# Patient Record
Sex: Female | Born: 1985 | Race: Black or African American | Hispanic: No | Marital: Single | Smoking: Never smoker
Health system: Southern US, Community
[De-identification: ages and names within clinical notes are randomized; demographics above are authoritative.]

## PROBLEM LIST (undated history)

## (undated) DIAGNOSIS — Z8742 Personal history of other diseases of the female genital tract: Secondary | ICD-10-CM

## (undated) DIAGNOSIS — F32A Depression, unspecified: Secondary | ICD-10-CM

## (undated) DIAGNOSIS — N879 Dysplasia of cervix uteri, unspecified: Secondary | ICD-10-CM

## (undated) DIAGNOSIS — F329 Major depressive disorder, single episode, unspecified: Secondary | ICD-10-CM

## (undated) HISTORY — DX: Personal history of other diseases of the female genital tract: Z87.42

## (undated) HISTORY — DX: Dysplasia of cervix uteri, unspecified: N87.9

## (undated) HISTORY — PX: CERVICAL CERCLAGE: SHX1329

---

## 2011-02-21 ENCOUNTER — Emergency Department: Payer: Self-pay | Admitting: Emergency Medicine

## 2011-04-19 ENCOUNTER — Emergency Department: Payer: Self-pay | Admitting: Internal Medicine

## 2013-04-21 HISTORY — PX: COLPOSCOPY: SHX161

## 2013-04-28 ENCOUNTER — Encounter: Payer: Self-pay | Admitting: Maternal & Fetal Medicine

## 2013-06-09 ENCOUNTER — Encounter: Payer: Self-pay | Admitting: Obstetrics and Gynecology

## 2013-06-21 ENCOUNTER — Inpatient Hospital Stay: Payer: Self-pay | Admitting: Obstetrics & Gynecology

## 2013-06-21 LAB — CBC WITH DIFFERENTIAL/PLATELET
Basophil %: 0.1 %
Eosinophil #: 0.2 10*3/uL (ref 0.0–0.7)
Eosinophil %: 0.6 %
HCT: 31 % — ABNORMAL LOW (ref 35.0–47.0)
Lymphocyte #: 1.1 10*3/uL (ref 1.0–3.6)
Lymphocyte %: 3.9 %
MCV: 66 fL — ABNORMAL LOW (ref 80–100)
Monocyte #: 2.9 x10 3/mm — ABNORMAL HIGH (ref 0.2–0.9)
Monocyte %: 10.8 %
Neutrophil #: 23 10*3/uL — ABNORMAL HIGH (ref 1.4–6.5)
Platelet: 177 10*3/uL (ref 150–440)
RDW: 14.8 % — ABNORMAL HIGH (ref 11.5–14.5)
WBC: 27.3 10*3/uL — ABNORMAL HIGH (ref 3.6–11.0)

## 2013-06-21 LAB — URINALYSIS, COMPLETE
Bilirubin,UR: NEGATIVE
Leukocyte Esterase: NEGATIVE
Nitrite: POSITIVE
Ph: 6 (ref 4.5–8.0)
Protein: NEGATIVE
RBC,UR: 4 /HPF (ref 0–5)
Specific Gravity: 1.009 (ref 1.003–1.030)
WBC UR: 1 /HPF (ref 0–5)

## 2013-06-21 LAB — DRUG SCREEN, URINE
Amphetamines, Ur Screen: NEGATIVE (ref ?–1000)
Barbiturates, Ur Screen: NEGATIVE (ref ?–200)
Benzodiazepine, Ur Scrn: NEGATIVE (ref ?–200)
Cocaine Metabolite,Ur ~~LOC~~: NEGATIVE (ref ?–300)
Methadone, Ur Screen: NEGATIVE (ref ?–300)
Opiate, Ur Screen: NEGATIVE (ref ?–300)
Tricyclic, Ur Screen: NEGATIVE (ref ?–1000)

## 2013-06-21 LAB — BASIC METABOLIC PANEL
Co2: 22 mmol/L (ref 21–32)
Creatinine: 0.64 mg/dL (ref 0.60–1.30)
EGFR (African American): >60
EGFR (Non-African Amer.): >60
Glucose: 100 mg/dL — ABNORMAL HIGH (ref 65–99)
Osmolality: 280 (ref 275–301)
Sodium: 141 mmol/L (ref 136–145)

## 2013-06-22 LAB — PATHOLOGY REPORT

## 2013-06-22 LAB — WBC: WBC: 17 10*3/uL — ABNORMAL HIGH (ref 3.6–11.0)

## 2013-06-22 LAB — HEMATOCRIT: HCT: 28.1 % — ABNORMAL LOW (ref 35.0–47.0)

## 2013-06-26 LAB — CULTURE, BLOOD (SINGLE)

## 2013-06-30 ENCOUNTER — Encounter: Payer: Self-pay | Admitting: Obstetrics and Gynecology

## 2014-08-29 ENCOUNTER — Emergency Department: Payer: Self-pay | Admitting: Internal Medicine

## 2014-09-02 ENCOUNTER — Emergency Department: Payer: Self-pay | Admitting: Emergency Medicine

## 2015-03-18 ENCOUNTER — Emergency Department: Payer: Self-pay | Admitting: Emergency Medicine

## 2015-05-01 NOTE — H&P (Signed)
L&D Evaluation:  History:  HPI 29 year old G2 P1001 with EDC=11/09/2013 by a 12 week 1 day ultrasound presents at 5 6/7 weeks via EMS. She reports she had PPROM and delivered a nonviable female infant at home. Upon arrival the baby was still attached to the cord and the placenta had not delivered. PNC at [facility] remarkable for early care hx of a prior SVD at term of a SGA baby (4-9), anemia, an LGSIL Pap (R/O HGSIL), an E.coli UTI 4/4, 5/2, 6/2 (UTI untreated from 5/2 to 6/2-did not pick up RX) and a short cx on [place] (06-09-2013). Patient had not yet begun vaginal progesterone for the short cx. Patient also has a hx of a speech and hearing impediment. LABS: AB POS, VI, RI   Presents with preterm labor and delivery at 19 6/7 weeks   Patient's Medical History Anemia, UTI   Patient's Surgical History none   Medications Pre Natal Vitamins   Allergies NKDA   Social History none   Family History Non-Contributory   ROS:  ROS see HPI   Exam:  Vital Signs T-101.4   General no apparent distress   Mental Status clear  tearful   Chest clear    Heart tachycardia, regular rhythm, no murmur   Abdomen slightly tender uterus   Back no CVAT   Pelvic no lacerations seen. Placenta at cx. minimal bleeding  noted   Impression:  Impression PPROM followed by PTL and Delivery of a nonviable female. Febrile. R/O UTI. R/O STI Possible chorio   Plan:  Plan LR with 20 U Pitocin begun at 125 ml/hr. Demerol 25 mgm IV given prior to assisting the delivery of the intact placenta trapped at the cervical os. GC/Chlamydai NAA sent. Cath UA sent as well as culture. Motrin for fever.   Comments CBC, met B   Electronic Signatures: Karene Fry (CNM)  (Signed 01-Jul-14 02:59)  Authored: L&D Evaluation   Last Updated: 01-Jul-14 02:59 by Karene Fry (CNM)

## 2015-05-22 ENCOUNTER — Other Ambulatory Visit: Payer: Self-pay | Admitting: Otolaryngology

## 2015-05-22 DIAGNOSIS — H9192 Unspecified hearing loss, left ear: Secondary | ICD-10-CM

## 2015-05-29 ENCOUNTER — Ambulatory Visit: Admission: RE | Admit: 2015-05-29 | Payer: Medicaid Other | Source: Ambulatory Visit

## 2015-06-05 ENCOUNTER — Other Ambulatory Visit: Payer: Self-pay | Admitting: Otolaryngology

## 2015-06-05 ENCOUNTER — Ambulatory Visit: Admission: RE | Admit: 2015-06-05 | Payer: Medicaid Other | Source: Ambulatory Visit

## 2015-06-05 ENCOUNTER — Ambulatory Visit
Admission: RE | Admit: 2015-06-05 | Discharge: 2015-06-05 | Disposition: A | Discharge status: Home / Self Care | Payer: Medicare Other | Source: Ambulatory Visit | Attending: Otolaryngology | Admitting: Otolaryngology

## 2015-06-05 DIAGNOSIS — H838X2 Other specified diseases of left inner ear: Secondary | ICD-10-CM | POA: Insufficient documentation

## 2015-06-05 DIAGNOSIS — H9190 Unspecified hearing loss, unspecified ear: Secondary | ICD-10-CM | POA: Diagnosis not present

## 2015-11-06 ENCOUNTER — Other Ambulatory Visit: Payer: Self-pay | Admitting: Family Medicine

## 2015-11-06 DIAGNOSIS — Z3201 Encounter for pregnancy test, result positive: Secondary | ICD-10-CM

## 2015-11-13 ENCOUNTER — Ambulatory Visit
Admission: RE | Admit: 2015-11-13 | Discharge: 2015-11-13 | Disposition: A | Discharge status: Home / Self Care | Payer: Medicare Other | Source: Ambulatory Visit | Attending: Family Medicine | Admitting: Family Medicine

## 2015-11-13 DIAGNOSIS — Z3A08 8 weeks gestation of pregnancy: Secondary | ICD-10-CM | POA: Diagnosis not present

## 2015-11-13 DIAGNOSIS — Z3201 Encounter for pregnancy test, result positive: Secondary | ICD-10-CM | POA: Diagnosis present

## 2015-11-13 DIAGNOSIS — Z36 Encounter for antenatal screening of mother: Secondary | ICD-10-CM | POA: Diagnosis present

## 2015-11-27 ENCOUNTER — Other Ambulatory Visit: Payer: Self-pay | Admitting: Family Medicine

## 2015-11-27 DIAGNOSIS — Z3481 Encounter for supervision of other normal pregnancy, first trimester: Secondary | ICD-10-CM

## 2015-12-07 ENCOUNTER — Ambulatory Visit: Admission: RE | Admit: 2015-12-07 | Payer: Medicare Other | Source: Ambulatory Visit

## 2015-12-23 NOTE — L&D Delivery Note (Signed)
Delivery Note At 3:04 PM a non-viable female was delivered via Vaginal Spontaneous Delivery (Presentation:Lt arm and then body and then vtx and legs ;  ).  APGAR: 0-0 , ; weight 9.2 oz (260 g).   Placenta status: , Spontaneous with 4 cm area that looked like an abruption,  Cord:  with the following complications:none. Bleeding controlled after delivery. Pt held baby and spent time with him. Pitocin after placenta. Amp and Gent infusing.   Anesthesia: None  Episiotomy: None Lacerations: None Suture Repair: none Est. Blood Loss (mL):  200 ml (clots)  Mom to postpartum.  Baby to [place].  Will stop Amp and Gent since pt is afebrile and chorioamnionitis should terminate since delivery has occurred.   Alexis Mercado 02/04/2016, 5:46 PM

## 2016-01-21 ENCOUNTER — Ambulatory Visit
Admission: RE | Admit: 2016-01-21 | Discharge: 2016-01-21 | Disposition: A | Discharge status: Home / Self Care | Payer: Medicare Other | Source: Ambulatory Visit | Attending: Family Medicine | Admitting: Family Medicine

## 2016-01-21 DIAGNOSIS — Z36 Encounter for antenatal screening of mother: Secondary | ICD-10-CM | POA: Insufficient documentation

## 2016-01-21 DIAGNOSIS — Z3A18 18 weeks gestation of pregnancy: Secondary | ICD-10-CM | POA: Diagnosis not present

## 2016-01-21 DIAGNOSIS — Z3481 Encounter for supervision of other normal pregnancy, first trimester: Secondary | ICD-10-CM

## 2016-01-24 ENCOUNTER — Other Ambulatory Visit: Payer: Self-pay | Admitting: Family Medicine

## 2016-01-24 DIAGNOSIS — Z3689 Encounter for other specified antenatal screening: Secondary | ICD-10-CM

## 2016-01-31 ENCOUNTER — Ambulatory Visit (HOSPITAL_BASED_OUTPATIENT_CLINIC_OR_DEPARTMENT_OTHER)
Admission: RE | Admit: 2016-01-31 | Discharge: 2016-01-31 | Disposition: A | Discharge status: Home / Self Care | Payer: Medicare Other | Source: Ambulatory Visit | Attending: Obstetrics and Gynecology | Admitting: Obstetrics and Gynecology

## 2016-01-31 ENCOUNTER — Ambulatory Visit: Payer: Medicare Other

## 2016-01-31 ENCOUNTER — Ambulatory Visit
Admission: RE | Admit: 2016-01-31 | Discharge: 2016-01-31 | Disposition: A | Discharge status: Home / Self Care | Payer: Medicare Other | Source: Ambulatory Visit | Attending: Family Medicine | Admitting: Family Medicine

## 2016-01-31 DIAGNOSIS — O09292 Supervision of pregnancy with other poor reproductive or obstetric history, second trimester: Secondary | ICD-10-CM

## 2016-01-31 DIAGNOSIS — O26879 Cervical shortening, unspecified trimester: Secondary | ICD-10-CM | POA: Diagnosis not present

## 2016-01-31 DIAGNOSIS — Z3689 Encounter for other specified antenatal screening: Secondary | ICD-10-CM

## 2016-01-31 DIAGNOSIS — Z3A19 19 weeks gestation of pregnancy: Secondary | ICD-10-CM | POA: Diagnosis not present

## 2016-01-31 DIAGNOSIS — O3442 Maternal care for other abnormalities of cervix, second trimester: Secondary | ICD-10-CM | POA: Diagnosis not present

## 2016-01-31 DIAGNOSIS — O09299 Supervision of pregnancy with other poor reproductive or obstetric history, unspecified trimester: Secondary | ICD-10-CM | POA: Insufficient documentation

## 2016-01-31 NOTE — Progress Notes (Signed)
[facility] Maternal-[facility]etal [facility] Consultation   Chief Complaint: History of midtrimester loss after short cervix and PPROM; current pregnancy complicated by short cervix  HPI: Ms. Alexis Mercado is a 30 y.o. [id number] at [redacted]w[redacted]d by [redacted]w[redacted]d [place] who presents in consultation from [facility] for prior pregnancy history of a 19 week delivery after short cervix and PPROM and current pregnancy complicated by a short cervix.  On 01/21/16 she had an ultrasound performed at [facility] at which time a TVUS demonstrated significant funneling of the cervix and intact cervical length of 0.4 cm.  She was subsequently scheduled for a consult at [facility] (today).  Past Medical History: Patient has a history of UTIs - she had multiple UTIs that were untreated at the time of her last delivery in 2015, including an E. Coli UTI on admission.  She had E. Coli on a urine culture in 11/2015 and was provided a prescription for Macrobid in January 2017 but states she was not aware of this and never took any antibiotics.  History of speech and hearing impediment ? Cognitive delay.  Past Surgical History: She  has no past surgical history on file.  Obstetric History:  OB History    Gravida Para Term Preterm AB TAB SAB Ectopic Multiple Living   04/29/2006 37 week NVD of female, 4#9oz, induced for FGR 06/20/2014 [redacted]w[redacted]d PPROM/delivery of nonviable female, pregnancy complicated by short cervix.  Maternal wbc on admission was 27.3 and pathology demonstrated funisitis and chorioamnionitis.  Patient was noted to have an E.coli UTI on admission.  Gynecologic History:  Patient's last menstrual period was 09/11/2015. She reports irregular periods. Last pap smear was normal.  She denies any procedures performed on her cervix.  Medications: PNVs. Allergies: Patient has No Known Allergies.  Social History: Patient  reports that she has never smoked. She does not have any smokeless tobacco history  on file. She reports that she does not drink alcohol or use illicit drugs. She lives with her daughter. Family History: family history includes Asthma in her daughter.  Review of Systems A full 12 point review of systems was negative or as noted in the History of Present Illness.  She specifically denies abdominal pain, cramping, urinary urgency, vaginal bleeding or leakage of fluid, fever, dysuria.  Physical Exam: LMP 09/11/2015 General appearance - alert, well appearing, and in no distress and normal appearing weight  [place] today at [facility] demonstrates normal fetal anatomy with transvaginal cervical length of 0.6 cm and deep funneling of the internal os with echogenic debris noted within the funnel.  Asessement: 1. History of pregnancy loss in prior pregnancy, currently pregnant in second trimester   2. Prior pregnancy complicated by SGA (small for gestational age), antepartum, second trimester   3. Short cervix affecting pregnancy     Plan: 29yo G3P1011 at 19 weeks 4 days by 8 week 2 day ultrasound (irregular cycles).  Pregnancy complicated by: 1.  History of FGR in first pregnancy 2.  History of short cervix followed by PPROM and delivery at [redacted]w[redacted]d at home in her most recent pregnancy (2015) at which time she had a wbc of 27.3 and evidence of funisitis and chorioamnionitis on placental pathology as well as a UTI 3.  Current pregnancy complicated by short cervix on 01/21/16 ultrasound at [facility].  Untreated UTI since December (E. Coli on culture)  We discussed recommendations for management.  In a  patient with a a prior pregnancy loss complicated by a short cervix and current pregnancy complicated by a short cervix, recommendation is for cerclage.  We discussed that at this gestational age and with a significantly funneled cervix, cerclage can be a more complicated procedure with risk of infection or iatrogenic PPROM and would not guarantee a term or near term delivery.  In  addition, she will need treatment (suggest IV antibiotics) for her UTI and evaluation for cervical infections prior to placement of cerclage.  We discussed that in future pregnancies, she would be a candidate for high risk obstetric care with consideration for a prophylactic cerclage placement at 12-14 weeks.  Alexis Mercado has limited means.  She is accompanied by her partner but neither of them drive and her grandfather, who dropped them off, does not drive on the highway.  She can name no other family members or friends who can help.  The hospital does not provide transportation or taxi payment for patients needing non emergent care at tertiary care facilities.  Thus, EMS was called for transport to [facility] L&D triage.  Dr. Idamae Schuller was called and updated on patient's history and was in agreement with plan.  Recommend social work consult while at [employer] given limited social support and ? cognitive limitations.  Will also likely need transfer of care to high risk Ob.   Total time spent with the patient was 30 minutes with greater than 50% spent in counseling and coordination of care. We appreciate this interesting consult and will be happy to be involved in the ongoing care of Alexis Mercado in anyway her obstetricians desire.  Kirby Funk, MD Maternal-Fetal Medicine [facility]

## 2016-02-04 ENCOUNTER — Inpatient Hospital Stay: Payer: Medicare Other | Admitting: Certified Registered"

## 2016-02-04 ENCOUNTER — Inpatient Hospital Stay
Admission: EM | Admit: 2016-02-04 | Discharge: 2016-02-05 | DRG: 775 | Disposition: A | Discharge status: Home / Self Care | Payer: Medicare Other | Attending: Obstetrics and Gynecology | Admitting: Obstetrics and Gynecology

## 2016-02-04 ENCOUNTER — Encounter
Admission: EM | Disposition: A | Discharge status: Home / Self Care | Payer: Self-pay | Source: Home / Self Care | Attending: Obstetrics and Gynecology

## 2016-02-04 DIAGNOSIS — O42912 Preterm premature rupture of membranes, unspecified as to length of time between rupture and onset of labor, second trimester: Secondary | ICD-10-CM | POA: Diagnosis present

## 2016-02-04 DIAGNOSIS — Z3A20 20 weeks gestation of pregnancy: Secondary | ICD-10-CM | POA: Diagnosis not present

## 2016-02-04 DIAGNOSIS — O411220 Chorioamnionitis, second trimester, not applicable or unspecified: Secondary | ICD-10-CM | POA: Diagnosis present

## 2016-02-04 DIAGNOSIS — Z79899 Other long term (current) drug therapy: Secondary | ICD-10-CM | POA: Diagnosis present

## 2016-02-04 DIAGNOSIS — O3432 Maternal care for cervical incompetence, second trimester: Secondary | ICD-10-CM | POA: Diagnosis present

## 2016-02-04 DIAGNOSIS — O9902 Anemia complicating childbirth: Secondary | ICD-10-CM | POA: Diagnosis present

## 2016-02-04 DIAGNOSIS — O09292 Supervision of pregnancy with other poor reproductive or obstetric history, second trimester: Secondary | ICD-10-CM

## 2016-02-04 DIAGNOSIS — O26879 Cervical shortening, unspecified trimester: Secondary | ICD-10-CM

## 2016-02-04 DIAGNOSIS — N883 Incompetence of cervix uteri: Secondary | ICD-10-CM | POA: Diagnosis present

## 2016-02-04 DIAGNOSIS — O26872 Cervical shortening, second trimester: Secondary | ICD-10-CM | POA: Diagnosis present

## 2016-02-04 LAB — CBC WITH DIFFERENTIAL/PLATELET
Basophils Absolute: 0.1 10*3/uL (ref 0–0.1)
Basophils Relative: 0 %
EOS ABS: 0.2 10*3/uL (ref 0–0.7)
EOS PCT: 1 %
HCT: 33.7 % — ABNORMAL LOW (ref 35.0–47.0)
Hemoglobin: 10.7 g/dL — ABNORMAL LOW (ref 12.0–16.0)
LYMPHS PCT: 8 %
Lymphs Abs: 1.4 10*3/uL (ref 1.0–3.6)
MCH: 20.4 pg — AB (ref 26.0–34.0)
MCHC: 31.9 g/dL — ABNORMAL LOW (ref 32.0–36.0)
MCV: 64 fL — AB (ref 80.0–100.0)
MONO ABS: 1.5 10*3/uL — AB (ref 0.2–0.9)
Monocytes Relative: 9 %
Neutro Abs: 14.2 10*3/uL — ABNORMAL HIGH (ref 1.4–6.5)
Neutrophils Relative %: 82 %
Platelets: 225 10*3/uL (ref 150–440)
RBC: 5.26 MIL/uL — AB (ref 3.80–5.20)
RDW: 15.6 % — ABNORMAL HIGH (ref 11.5–14.5)
WBC: 17.4 10*3/uL — AB (ref 3.6–11.0)

## 2016-02-04 LAB — URINALYSIS COMPLETE WITH MICROSCOPIC (ARMC ONLY)
BACTERIA UA: NONE SEEN
Bilirubin Urine: NEGATIVE
Glucose, UA: NEGATIVE mg/dL
KETONES UR: NEGATIVE mg/dL
NITRITE: NEGATIVE
Protein, ur: NEGATIVE mg/dL
SPECIFIC GRAVITY, URINE: 1.013 (ref 1.005–1.030)
pH: 6 (ref 5.0–8.0)

## 2016-02-04 LAB — URINE DRUG SCREEN, QUALITATIVE (ARMC ONLY)
Amphetamines, Ur Screen: NOT DETECTED
BARBITURATES, UR SCREEN: NOT DETECTED
BENZODIAZEPINE, UR SCRN: NOT DETECTED
Cannabinoid 50 Ng, Ur ~~LOC~~: NOT DETECTED
Cocaine Metabolite,Ur ~~LOC~~: NOT DETECTED
MDMA (Ecstasy)Ur Screen: NOT DETECTED
METHADONE SCREEN, URINE: NOT DETECTED
OPIATE, UR SCREEN: NOT DETECTED
Phencyclidine (PCP) Ur S: NOT DETECTED
TRICYCLIC, UR SCREEN: NOT DETECTED

## 2016-02-04 LAB — ABO/RH: ABO/RH(D): AB POS

## 2016-02-04 LAB — TYPE AND SCREEN
ABO/RH(D): AB POS
ANTIBODY SCREEN: NEGATIVE

## 2016-02-04 LAB — CREATININE, SERUM: Creatinine, Ser: 0.71 mg/dL (ref 0.44–1.00)

## 2016-02-04 SURGERY — CERCLAGE, CERVIX, VAGINAL APPROACH
Anesthesia: Spinal

## 2016-02-04 MED ORDER — ONDANSETRON HCL 4 MG PO TABS: 4.0000 mg | ORAL_TABLET | ORAL | Status: DC | PRN

## 2016-02-04 MED ORDER — GENTAMICIN SULFATE 40 MG/ML IJ SOLN
2.0000 mg/kg | Freq: Once | INTRAVENOUS | Status: DC
  Filled 2016-02-04: qty 3.5

## 2016-02-04 MED ORDER — TETANUS-DIPHTH-ACELL PERTUSSIS 5-2.5-18.5 LF-MCG/0.5 IM SUSP
0.5000 mL | Freq: Once | INTRAMUSCULAR | Status: DC
  Filled 2016-02-04: qty 0.5

## 2016-02-04 MED ORDER — WITCH HAZEL-GLYCERIN EX PADS: 1.0000 "application " | MEDICATED_PAD | CUTANEOUS | Status: DC | PRN

## 2016-02-04 MED ORDER — MEASLES, MUMPS & RUBELLA VAC ~~LOC~~ INJ
0.5000 mL | INJECTION | Freq: Once | SUBCUTANEOUS | Status: DC
  Filled 2016-02-04: qty 0.5

## 2016-02-04 MED ORDER — PRENATAL MULTIVITAMIN CH
1.0000 | ORAL_TABLET | Freq: Every day | ORAL | Status: DC
  Filled 2016-02-04: qty 1

## 2016-02-04 MED ORDER — SENNOSIDES-DOCUSATE SODIUM 8.6-50 MG PO TABS: 2.0000 | ORAL_TABLET | ORAL | Status: DC

## 2016-02-04 MED ORDER — ACETAMINOPHEN 325 MG PO TABS: 650.0000 mg | ORAL_TABLET | ORAL | Status: DC | PRN

## 2016-02-04 MED ORDER — SODIUM CHLORIDE FLUSH 0.9 % IV SOLN
INTRAVENOUS | Status: AC
  Filled 2016-02-04: qty 10

## 2016-02-04 MED ORDER — TERBUTALINE SULFATE 1 MG/ML IJ SOLN
INTRAMUSCULAR | Status: AC
  Filled 2016-02-04: qty 1

## 2016-02-04 MED ORDER — LANOLIN HYDROUS EX OINT: TOPICAL_OINTMENT | CUTANEOUS | Status: DC | PRN

## 2016-02-04 MED ORDER — TERBUTALINE SULFATE 1 MG/ML IJ SOLN: 0.2500 mg | Freq: Once | INTRAMUSCULAR | Status: DC

## 2016-02-04 MED ORDER — ZOLPIDEM TARTRATE 5 MG PO TABS: 5.0000 mg | ORAL_TABLET | Freq: Every evening | ORAL | Status: DC | PRN

## 2016-02-04 MED ORDER — TERBUTALINE SULFATE 1 MG/ML IJ SOLN
0.2500 mg | Freq: Once | INTRAMUSCULAR | Status: AC
  Administered 2016-02-04: 0.25 mg via SUBCUTANEOUS

## 2016-02-04 MED ORDER — BISACODYL 10 MG RE SUPP
10.0000 mg | Freq: Every day | RECTAL | Status: DC | PRN
  Filled 2016-02-04: qty 1

## 2016-02-04 MED ORDER — OXYCODONE-ACETAMINOPHEN 5-325 MG PO TABS: 2.0000 | ORAL_TABLET | ORAL | Status: DC | PRN

## 2016-02-04 MED ORDER — BUTORPHANOL TARTRATE 1 MG/ML IJ SOLN
1.0000 mg | INTRAMUSCULAR | Status: DC | PRN
  Administered 2016-02-04: 1 mg via INTRAVENOUS
  Filled 2016-02-04: qty 1

## 2016-02-04 MED ORDER — SODIUM CHLORIDE 0.9 % IV SOLN
3.0000 g | Freq: Four times a day (QID) | INTRAVENOUS | Status: DC
  Filled 2016-02-04 (×3): qty 3

## 2016-02-04 MED ORDER — FLEET ENEMA 7-19 GM/118ML RE ENEM: 1.0000 | ENEMA | Freq: Every day | RECTAL | Status: DC | PRN

## 2016-02-04 MED ORDER — IBUPROFEN 600 MG PO TABS: 600.0000 mg | ORAL_TABLET | Freq: Four times a day (QID) | ORAL | Status: DC

## 2016-02-04 MED ORDER — SODIUM CHLORIDE 0.9% FLUSH: 3.0000 mL | INTRAVENOUS | Status: DC | PRN

## 2016-02-04 MED ORDER — GENTAMICIN SULFATE 40 MG/ML IJ SOLN
340.0000 mg | INTRAVENOUS | Status: DC
  Administered 2016-02-04: 340 mg via INTRAVENOUS
  Filled 2016-02-04 (×2): qty 8.5

## 2016-02-04 MED ORDER — OXYCODONE-ACETAMINOPHEN 5-325 MG PO TABS: 1.0000 | ORAL_TABLET | ORAL | Status: DC | PRN

## 2016-02-04 MED ORDER — BENZOCAINE-MENTHOL 20-0.5 % EX AERO: 1.0000 "application " | INHALATION_SPRAY | CUTANEOUS | Status: DC | PRN

## 2016-02-04 MED ORDER — AMPICILLIN SODIUM 2 G IJ SOLR
2.0000 g | Freq: Once | INTRAMUSCULAR | Status: AC
  Administered 2016-02-04: 2 g via INTRAVENOUS
  Filled 2016-02-04: qty 2000

## 2016-02-04 MED ORDER — SODIUM CHLORIDE 0.9 % IV SOLN: 250.0000 mL | INTRAVENOUS | Status: DC | PRN

## 2016-02-04 MED ORDER — ONDANSETRON HCL 4 MG/2ML IJ SOLN: 4.0000 mg | INTRAMUSCULAR | Status: DC | PRN

## 2016-02-04 MED ORDER — ACETAMINOPHEN 325 MG PO TABS
650.0000 mg | ORAL_TABLET | ORAL | Status: DC | PRN
  Administered 2016-02-04: 650 mg via ORAL
  Filled 2016-02-04: qty 2

## 2016-02-04 MED ORDER — DIBUCAINE 1 % RE OINT: 1.0000 "application " | TOPICAL_OINTMENT | RECTAL | Status: DC | PRN

## 2016-02-04 MED ORDER — SODIUM CHLORIDE 0.9 % IV SOLN: 1.0000 g | Freq: Four times a day (QID) | INTRAVENOUS | Status: DC

## 2016-02-04 MED ORDER — SODIUM CHLORIDE 0.9% FLUSH: 3.0000 mL | Freq: Two times a day (BID) | INTRAVENOUS | Status: DC

## 2016-02-04 MED ORDER — LACTATED RINGERS IV SOLN: INTRAVENOUS | Status: DC

## 2016-02-04 MED ORDER — OXYTOCIN 40 UNITS IN LACTATED RINGERS INFUSION - SIMPLE MED
1.0000 m[IU]/min | INTRAVENOUS | Status: DC
  Administered 2016-02-04: 1 m[IU]/min via INTRAVENOUS
  Filled 2016-02-04: qty 1000

## 2016-02-04 MED ORDER — DIPHENHYDRAMINE HCL 25 MG PO CAPS: 25.0000 mg | ORAL_CAPSULE | Freq: Four times a day (QID) | ORAL | Status: DC | PRN

## 2016-02-04 MED ORDER — SIMETHICONE 80 MG PO CHEW: 80.0000 mg | CHEWABLE_TABLET | ORAL | Status: DC | PRN

## 2016-02-04 SURGICAL SUPPLY — 18 items
CANISTER SUCT 1200ML W/VALVE (MISCELLANEOUS) ×3 IMPLANT
CATH ROBINSON RED A/P 16FR (CATHETERS) ×3 IMPLANT
DRAPE UNDER BUTTOCK W/FLU (DRAPES) ×3 IMPLANT
ELECT REM PT RETURN 9FT ADLT (ELECTROSURGICAL) ×3
ELECTRODE REM PT RTRN 9FT ADLT (ELECTROSURGICAL) ×1 IMPLANT
GLOVE BIO SURGEON STRL SZ8 (GLOVE) ×3 IMPLANT
GOWN STRL REUS W/ TWL LRG LVL3 (GOWN DISPOSABLE) ×1 IMPLANT
GOWN STRL REUS W/ TWL XL LVL3 (GOWN DISPOSABLE) ×1 IMPLANT
GOWN STRL REUS W/TWL LRG LVL3 (GOWN DISPOSABLE) ×2
GOWN STRL REUS W/TWL XL LVL3 (GOWN DISPOSABLE) ×2
KIT RM TURNOVER CYSTO AR (KITS) ×3 IMPLANT
LABEL OR SOLS (LABEL) ×3 IMPLANT
NS IRRIG 500ML POUR BTL (IV SOLUTION) ×3 IMPLANT
PACK BASIN MINOR ARMC (MISCELLANEOUS) IMPLANT
PAD OB MATERNITY 4.3X12.25 (PERSONAL CARE ITEMS) ×3 IMPLANT
PAD PREP 24X41 OB/GYN DISP (PERSONAL CARE ITEMS) ×3 IMPLANT
SURGILUBE 2OZ TUBE FLIPTOP (MISCELLANEOUS) ×3 IMPLANT
TAPE MERSILENE 5MM 36 OS-8 WHT (SUTURE) ×3 IMPLANT

## 2016-02-04 NOTE — Plan of Care (Signed)
Patient reluctant to agree on foley placement.  Explained to patient and fob need for placement and where it is placed anatomically.  Attempted to place x2, patient swinging arms and kicking legs, refusing.  Provider notified.  Will allow patient to urinate in bed on towels for now since in trendelenburg position.  Labs collected and sent by lab

## 2016-02-04 NOTE — Progress Notes (Signed)
Patient escorted to OR in bed by [facility] OR tech. Family with patient. Will continue to monitor.

## 2016-02-04 NOTE — Progress Notes (Signed)
CTSP as "water broke". Large amt of clear fluid noted from vagina. Cx exam indicated cx is C/C/ but no presenting part is palpable. Explained to family that there is nothing further that can be done to save the baby. Amp and Gent ordered. Will add Pitocin to deliver the fetus and placenta. Pt and FOB aware and no refusal noted. A: IUP at 20 2/7 weeks with PPROM/cx incompetence P: 1. Start Pitocin per protocol to augment delivery of non-viable fetus. 2. Will tx with pain meds if needed. 3. RN advised that we do not need to check FHT's as baby is non-viable. 4. Dr Feliberto Gottron will be updated with pt status.

## 2016-02-04 NOTE — OR Nursing (Signed)
Dr. Feliberto Gottron notified that patient's temperature is 100.1.  Order received to transfer patient back to L&D.

## 2016-02-04 NOTE — OB Triage Note (Signed)
Patient presents with c/o scant-small amount of pinkish discharge tonight at 0200 when wiping after using bathroom.  Denies any c/o cramping, UC, or back pain.

## 2016-02-04 NOTE — OB Triage Note (Signed)
Urine obtained and sent to lab, blood tinged.  sse performed red blood noted on perineum and legs.  hourglassing membranes seen.

## 2016-02-04 NOTE — Progress Notes (Signed)
PM Rounds done: Pt is sitting upright holding fetus in a blanket and bonding with him. FOB at foot of bed. T 98.8 P103 R24 BP 107/80 Post Partum 5 hrs from delivery Subjective: No complaints, denies any abd pain or other problems  Objective: Blood pressure 107/80, pulse 101, temperature 98 F (36.7 C), temperature source Axillary, resp. rate 16, height 5' (1.524 m), weight 153 lb (69.4 kg), last menstrual period 09/11/2015, SpO2 100 %.  Physical Exam:  General: Afebrile, VSS. No signs of infection as fever is down.  Lochia: appropriate Uterine Fundus: Firm DVT Evaluation: neg Homans (SCD's in use)   Recent Labs  02/04/16 0430  HGB 10.7*  HCT 33.7*  WBC 17.4*  PLT 225    Assessment/Plan: A: NSVD of non-viable female infant with initial chorioamnionitis 2. Maternal fever which has resolved since this am (AMP and Gent) given and stopped P: Observe overnight. 2. Disc with couple that pt would need a cx cerclage in the future for any further pregnancies. 3. Continue to monitor VS.    LOS: 0 days   DEFNE GERLING 02/04/2016, 8:43 PM

## 2016-02-04 NOTE — Progress Notes (Signed)
Pt arrived back from OR after diagnosed with chorioamnionitis with Temp of 100.1 po and WBC of 17.  Explained to pt and FOB that pt is infected and that it is to late to place a cx cerclage. Pt also declined a cerclage placement in the past few weeks. Pt has mod amt of vaginal bleeding noted between thighs and running out of vagina. A sterile spec exam was done and an intact BOW was seen protruding through the cx. A very gentle vag exam was done and the cx is now complete and the BOW is herniated through the cx and sitting in the vagina. Fetal parts were not palpated and due to the tension, did not probe to avoid rupturing the sac. FHR: 140 with Doppler. No UC's per pt. Pt is now crying and FOB was yelling at the pt as "it is to late to do the cerclage". Will start Amp and Gent per protocol. Will allow pt to grieve for now and get the antibiotics in.

## 2016-02-04 NOTE — Progress Notes (Signed)
S: No UC's felt or back pain. O: Temp was 100.1 in the OR. VSS, T98.8 here in [facility]. FHR: 140 per Doppler IV antibiotics hanging. Ampicillin 2 gms IV and then 1 gm an hour. Gent per pharmacy orders and calculated for this pt. Cx: C/C/bulging BOW with vag bleeding mod seen A: 1. IUP at 20 2/7 weeks with cx incompetence 2. Advanced cx dilation/PTL P; Disc findings with pt and FOB. Pt is hungry and wants to eat. Advised that her  BOW will pop and then she will deliver and she may not be able to  Deliver the placenta and may need surgery. Pt is NPO for now. Disc AROM and delivery of fetus as there is nothing that can be done Now to prevent PTD and baby is non-viable. Pt wants to wait for SROM to occur. Pt does not want intervention currently. Will observe pt closely.

## 2016-02-04 NOTE — Discharge Summary (Signed)
Obstetric Discharge Summary   Patient ID: Alexis Mercado MRN: [id number] DOB/AGE: Mar 15, 1986 30 y.o.   Date of Admission: 02/04/2016  Date of Discharge:  02/05/16 Admitting Diagnosis: Advanced cervical dilation and PTL at [redacted]w[redacted]d, vaginal bleeding in second trimester  Secondary Diagnosis: Anemia in pregnancy, Short cervix with Cervical funneling   Mode of Delivery: Pre-term vaginal delivery of a non-viable female     Discharge Diagnosis: Chorioamnionitis, Post-vaginal delivery   Intrapartum Procedures: pitocin augmentation   Post partum procedures: none Clinical Social Work Consult completed prior to dishrge  Complications: chorioamnionitis and cervical incompetence   Brief Hospital Course  Alexis Mercado is a [id number] who had a SVD on 02/04/16;  for further details of this delivery, please refer to the delivery note.  Patient had an uncomplicated postpartum course.  By time of discharge on PPD#1, her pain was controlled without medication; she had appropriate lochia and was ambulating, voiding without difficulty and tolerating regular diet.  WBC is trending down, afebrile, no s/s of infection noted. She was deemed stable for discharge to home. CSW arranged taxi voucher for discharge d/t lack of transportation.     Labs: CBC Latest Ref Rng 02/04/2016 06/22/2013 06/21/2013  WBC 3.6 - 11.0 K/uL 17.4(H) 17.0(H) 27.3(H)  Hemoglobin 12.0 - 16.0 g/dL 10.7(L) - 9.9(L)  Hematocrit 35.0 - 47.0 % 33.7(L) 28.1(L) 31.0(L)  Platelets 150 - 440 K/uL 225 - 177   AB POS  Physical exam:  Blood pressure 107/80, pulse 101, temperature 98 F (36.7 C), temperature source Axillary, resp. rate 16, height 5' (1.524 m), weight 153 lb (69.4 kg), last menstrual period 09/11/2015, SpO2 100 %. General: alert and no distress, appropriately sad after loss, not tearful Lochia: appropriate Abdomen: soft, NT Uterine Fundus: firm Extremities: No evidence of DVT seen on physical exam. No lower extremity edema. No lacs  or epis Discharge Instructions: Per After Visit Summary. Activity: Advance as tolerated. Pelvic rest for 6 weeks.  Also refer to After Visit Summary Diet: Regular Medications:   Medication List    TAKE these medications        ferrous sulfate 325 (65 FE) MG tablet  Take 1 tablet (325 mg total) by mouth 2 (two) times daily with a meal.      ASK your doctor about these medications        prenatal multivitamin Tabs tablet  Take 1 tablet by mouth daily at 12 noon.       Discharge Instructions    Activity as tolerated    Complete by:  As directed      Call MD for:  difficulty breathing, headache or visual disturbances    Complete by:  As directed      Call MD for:  extreme fatigue    Complete by:  As directed      Call MD for:  hives    Complete by:  As directed      Call MD for:  persistant dizziness or light-headedness    Complete by:  As directed      Call MD for:  persistant nausea and vomiting    Complete by:  As directed      Call MD for:  redness, tenderness, or signs of infection (pain, swelling, redness, odor or green/yellow discharge around incision site)    Complete by:  As directed      Call MD for:  severe uncontrolled pain    Complete by:  As directed      Call MD for:  temperature >100.4    Complete by:  As directed      Diet - low sodium heart healthy    Complete by:  As directed      Discharge instructions    Complete by:  As directed   Go to the emergency room immediately with worsening s/s of depression, unable to take care of yourself, suicidal or homicidal thoughts     Sexual acrtivity    Complete by:  As directed   No intercourse until seen at 2 weeks to discuss future conception and contraception plans           Outpatient follow up:   Discharge to: home Follow-up Information    Follow up with SCHERMERHORN,THOMAS, MD. Schedule an appointment as soon as possible for a visit in 2 weeks.   Specialty: Obstetrics and Gynecology   Why:  Postpartum check after fetal demise at 20 weeks    Contact information:   [address] [place] [address] [phone]         Postpartum contraception: unsure  Discharged Condition: stable  Discharged to: home  Newborn Data:  Baby Boy   Disposition:morgue - planning cremation  APGARS: 0, 0  Baby Feeding: none   Dr. Dalbert Garnet aware and agrees with plan   Carlean Jews, CNM

## 2016-02-04 NOTE — Progress Notes (Signed)
Fetal Heart Rate 142 @ 1041 am per Manfred Arch, RN  Pt arrived to [facility] preop with #24G left hand and IVF LR infusing (LDS not documented in EPIC)

## 2016-02-04 NOTE — Anesthesia Preprocedure Evaluation (Deleted)
Anesthesia Evaluation  Patient identified by MRN, date of birth, ID band Patient awake    Reviewed: Allergy & Precautions, NPO status , Patient's Chart, lab work & pertinent test results  Airway Mallampati: II  TM Distance: >3 FB Neck ROM: Full    Dental  (+) Chipped   Pulmonary neg pulmonary ROS,    Pulmonary exam normal breath sounds clear to auscultation       Cardiovascular negative cardio ROS Normal cardiovascular exam     Neuro/Psych negative neurological ROS  negative psych ROS   GI/Hepatic negative GI ROS, Neg liver ROS,   Endo/Other  negative endocrine ROS  Renal/GU negative Renal ROS  negative genitourinary   Musculoskeletal negative musculoskeletal ROS (+)   Abdominal Normal abdominal exam  (+)   Peds negative pediatric ROS (+)  Hematology negative hematology ROS (+)   Anesthesia Other Findings   Reproductive/Obstetrics (+) Pregnancy                             Anesthesia Physical Anesthesia Plan  ASA: II  Anesthesia Plan: Spinal   Post-op Pain Management:    Induction: Intravenous  Airway Management Planned:   Additional Equipment:   Intra-op Plan:   Post-operative Plan:   Informed Consent: I have reviewed the patients History and Physical, chart, labs and discussed the procedure including the risks, benefits and alternatives for the proposed anesthesia with the patient or authorized representative who has indicated his/her understanding and acceptance.     Plan Discussed with: CRNA and Surgeon  Anesthesia Plan Comments:         Anesthesia Quick Evaluation

## 2016-02-04 NOTE — H&P (Signed)
ANTEPARTUM ADMISSION HISTORY AND PHYSICAL NOTE   History of Present Illness: Alexis Mercado is a 30 y.o. G3P0011 at [redacted]w[redacted]d by unsure LMP of 09/11/15 but confirmed early [place] on 11/13/15 at 8+2 weeks with an EDC of 06/22/2016 admitted for threatened Preterm labor.  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  This pregnancy is complicated by short cervix (0.6cm), hearing impairment vs. Cognitive delay, h/o 19wk loss at home SROM with imminent delivery with chorioamnionitis, h/o UTI this pregnancy (untreated), h/o FGR in prior pregnancy, limited transportation and resources. She has not been on 17-P. At the [facility] consult on 01/31/16, she was transferred to [facility] by EMS and was offered cerclage versus vaginal progesterone  For cervical funneling and extensively counseled.  She opted for vaginal progesterone and has been using it since 01/31/16.  She was treated on 01/31/16 for asymptomatic UTI with IV Rocephin and for Bacterial Vaginosis with Flagyl.  She lives with her mother, FOB, and 63 yo daughter.   She states she woke up at 2am to use the restroom and had "light pink" discharge when she wipes.  She denies abdominal pain, cramping, low back pain, abdominal tightening, LOF, dysuria, bright red vaginal bleeding, passing clots or tissue, and pressure.  She reports good fetal movement. Her blood type on 01/31/16 was AB Positive.    Patient Active Problem List   Diagnosis Date Noted  . Premature cervical dilation in second trimester 02/04/2016  . History of pregnancy loss in prior pregnancy, currently pregnant in second trimester 01/31/2016  . Prior pregnancy complicated by SGA (small for gestational age), antepartum 01/31/2016  . Short cervix affecting pregnancy 01/31/2016    No past medical history on file.  No past surgical history on file.  OB History  Gravida Para Term Preterm AB SAB TAB Ectopic Multiple Living  # Outcome Date GA Lbr Len/2nd Weight Sex Delivery Anes PTL Lv  3 Current           2 SAB 2015 [redacted]w[redacted]d   F    FD  1 Para 2007 [redacted]w[redacted]d  2.07 kg (4 lb 9 oz) F Vag-Spont  N Y      Social History   Social History  . Marital Status: Single    Spouse Name: N/A  . Number of Children: N/A  . Years of Education: N/A   Social History Main Topics  . Smoking status: Never Smoker   . Smokeless tobacco: Not on file  . Alcohol Use: No  . Drug Use: No  . Sexual Activity: Yes   Other Topics Concern  . Not on file   Social History Narrative    Family History  Problem Relation Age of Onset  . Asthma Daughter     No Known Allergies  Prescriptions prior to admission  Medication Sig Dispense Refill Last Dose  . Prenatal Vit-Fe Fumarate-FA (PRENATAL MULTIVITAMIN) TABS tablet Take 1 tablet by mouth daily at 12 noon.   Taking    Review of Systems - History obtained from chart review General ROS: negative for - chills, fatigue or fever Psychological ROS: positive for - anxiety Respiratory ROS: no cough, shortness of breath, or wheezing negative Cardiovascular ROS: no chest pain or dyspnea on exertion negative Gastrointestinal ROS: no abdominal pain, change in bowel habits, or black or bloody stools Genito-Urinary ROS: positive for - hematuria and reports "pink  spotting" when she wipes, was using vaginal progesterone Musculoskeletal ROS: negative Neurological ROS: negative  Vitals:  BP 121/82 mmHg  Pulse 108  Temp(Src) 97.7 F (36.5 C) (Oral)  Resp 18  LMP 09/11/2015 Physical Examination: CONSTITUTIONAL: Well-developed, well-nourished female in no acute distress.  HENT:  Normocephalic, atraumatic, External right and left ear normal. Oropharynx is clear and moist EYES: Conjunctivae and EOM are normal. Pupils are equal, round, and reactive to light. No scleral icterus.  NECK: Normal range of motion, supple, no masses SKIN: Skin is warm and dry. No rash noted. Not diaphoretic. No  erythema. No pallor. NEUROLGIC: Alert and oriented to person, place, and time. Normal reflexes, muscle tone coordination. No cranial nerve deficit noted. PSYCHIATRIC: Normal mood and affect. Normal behavior. Normal judgment and thought content. CARDIOVASCULAR: Normal heart rate noted, regular rhythm RESPIRATORY: Effort and breath sounds normal, no problems with respiration noted ABDOMEN: Soft, nontender, nondistended, gravid. MUSCULOSKELETAL: Normal range of motion. No edema and no tenderness. 2+ distal pulses.  Cervix: Evaluated by sterile speculum exam. Bright red vaginal bleeding noted in vaginal vault. Position: anterior, Dilation: 6cm,  and fetal presentation is unknown - on last ultrasound on 01/31/16  breech presentation was noted  Membranes:intact, sterile speculum exam - intact, hourglass, protruding membranes noted through cervical os Fetal Monitoring:Doppler: 143 bpm Tocometer: uterine contractions every 4 minutes   Labs:  Results for orders placed or performed during the hospital encounter of 02/04/16 (from the past 24 hour(s))  Urinalysis complete, with microscopic ([facility] only)   Collection Time: 02/04/16  3:57 AM  Result Value Ref Range   Color, Urine YELLOW (A) YELLOW   APPearance CLOUDY (A) CLEAR   Glucose, UA NEGATIVE NEGATIVE mg/dL   Bilirubin Urine NEGATIVE NEGATIVE   Ketones, ur NEGATIVE NEGATIVE mg/dL   Specific Gravity, Urine 1.013 1.005 - 1.030   Hgb urine dipstick 3+ (A) NEGATIVE   pH 6.0 5.0 - 8.0   Protein, ur NEGATIVE NEGATIVE mg/dL   Nitrite NEGATIVE NEGATIVE   Leukocytes, UA 3+ (A) NEGATIVE   RBC / HPF TOO NUMEROUS TO COUNT 0 - 5 RBC/hpf   WBC, UA TOO NUMEROUS TO COUNT 0 - 5 WBC/hpf   Bacteria, UA NONE SEEN NONE SEEN   Squamous Epithelial / LPF 0-5 (A) NONE SEEN   Mucous PRESENT   CBC with Differential/Platelet   Collection Time: 02/04/16  4:30 AM  Result Value Ref Range   WBC 17.4 (H) 3.6 - 11.0 K/uL   RBC 5.26 (H) 3.80 - 5.20 MIL/uL   Hemoglobin 10.7  (L) 12.0 - 16.0 g/dL   HCT 96.0 (L) 45.4 - 09.8 %   MCV 64.0 (L) 80.0 - 100.0 fL   MCH 20.4 (L) 26.0 - 34.0 pg   MCHC 31.9 (L) 32.0 - 36.0 g/dL   RDW 11.9 (H) 14.7 - 82.9 %   Platelets 225 150 - 440 K/uL   Neutrophils Relative % 82 %   Neutro Abs 14.2 (H) 1.4 - 6.5 K/uL   Lymphocytes Relative 8 %   Lymphs Abs 1.4 1.0 - 3.6 K/uL   Monocytes Relative 9 %   Monocytes Absolute 1.5 (H) 0.2 - 0.9 K/uL   Eosinophils Relative 1 %   Eosinophils Absolute 0.2 0 - 0.7 K/uL   Basophils Relative 0 %   Basophils Absolute 0.1 0 - 0.1 K/uL  Type and screen [facility]   Collection Time: 02/04/16  4:30 AM  Result Value Ref Range   ABO/RH(D) AB POS  Antibody Screen NEG    Sample Expiration 02/07/2016    Reviewed labs from [facility] on 01/31/16: GC/CT negative, UDS negative, +E. Coli, +clue cells, WBC 17.5 Imaging Studies: US Ob Comp + 14 Wk  01/21/2016  CLINICAL DATA:  Prenatal care. 30 year old gravida 3 para 1 AB 1. By LMP patient is 18 weeks 6 days. By first ultrasound patient is 18 weeks 1 day. EXAM: OBSTETRICAL ULTRASOUND >14 WKS FINDINGS: Number of Fetuses: 1 Heart Rate:  150 bpm Movement: Present Presentation: Variable Previa: Not seen Placental Location: Posterior Amniotic Fluid (Subjective): Normal Amniotic Fluid (Objective): Vertical pocket 4.4cm FETAL BIOMETRY BPD:  4.1cm 18w tod HC:    15.4cm  18w   3d AC:   12.9cm  18w   3d FL:   2.6cm  18w   0d Current Mean GA: 18w 2d              [place] EDC: 06/21/2016 FETAL ANATOMY Lateral Ventricles: Visualized Thalami/CSP: Visualized Posterior Fossa:  Visualized Nuchal Region: Visualized    NFT= 2.50mm Upper Lip: Visualized Spine: Visualized 4 Chamber Heart on Left: Visualized LVOT: Visualized RVOT: Visualized Stomach on Left: Visualized 3 Vessel Cord: Visualized/ Cord Insertion site: Visualized Kidneys: Visualized Bladder: Visualized Extremities: Visualized Sex: Female Technically difficult due to: Not applicable Maternal Findings:  Cervix: There is funneling of the internal os measuring 0.9 cm in width and 2.7 cm in length. The length of the closed portion of the cervix at the external os is 0.4 cm on transvaginal and transabdominal evaluation. IMPRESSION: 1. Single living intrauterine fetus in variable presentation. 2. Size and dates correlate well, confirming assigned EDC of 06/17/2016. 3. Shortened cervical length, measuring 0.4 cm. Significant funneling of the internal os. The salient findings were discussed with Deontay Ladnier SOLES on 01/21/2016 at 2:06 pm. Electronically Signed   By: Norva Pavlov M.D.   On: 01/21/2016 14:23   [place] Mfm Ob Detail +14 Wk  01/31/2016  OBSTETRICAL ULTRASOUND: This exam was performed within a [place] [facility]. The OB US report was generated in the AS system, and faxed to the ordering physician.  This report is available in the [employer]. See the AS Obstetric US report via the Image Link. 01/31/16: IUP at 19+4 weeks, small anterior intramural fibroid 2.1cm x 1.6cm x 1.2cm, no adnexal masses seen, TVUS shows deep/wide funneling of the cervix with intact residual cervical length of 0.6cm.  There is some echogenic debris noted within the funnel  Assessment and Plan: Patient Active Problem List   Diagnosis Date Noted  . Premature cervical dilation in second trimester 02/04/2016  . History of pregnancy loss in prior pregnancy, currently pregnant in second trimester 01/31/2016  . Prior pregnancy complicated by SGA (small for gestational age), antepartum 01/31/2016  . Short cervix affecting pregnancy 01/31/2016   Admit to Birth Place NPO Insert Foley Catheter Terbutaline 0.25mg  subcutaneous x 1 Insert PIV - begin LR  ml/hr CBC, Type at Screen, UA, UDS now Trendelenburg position  Discussed with Dr. Feliberto Gottron - he will come first thing in the morning to discuss cerclage - he is aware and agrees with plan  Pt. And her partner are amenable to this plan   Carlean Jews, CNM

## 2016-02-04 NOTE — Progress Notes (Signed)
ANTIBIOTIC CONSULT NOTE - INITIAL  Pharmacy Consult for Gentamicin Indication: bacteremia  No Known Allergies  Patient Measurements: Height: 5' (152.4 cm) Weight: 153 lb (69.4 kg) IBW/kg (Calculated) : 45.5  Vital Signs: Temp: 98.3 F (36.8 C) (02/13 1230) Temp Source: Oral (02/13 1230) BP: 124/76 mmHg (02/13 1042) Pulse Rate: 115 (02/13 1042) Intake/Output from previous day:   Intake/Output from this shift: Total I/O In: 50 [IV Piggyback:50] Out: -   Labs:  Recent Labs  02/04/16 0430  WBC 17.4*  HGB 10.7*  PLT 225   CrCl cannot be calculated (Patient has no serum creatinine result on file.).  Microbiology: No results found for this or any previous visit (from the past 720 hour(s)).  Medical History: Past Medical History  Diagnosis Date  . Preterm labor     Medications:  Scheduled:  . ampicillin (OMNIPEN) IV  1 g Intravenous 4 times per day  . gentamicin  340 mg Intravenous Q24H  . sodium chloride flush      . sodium chloride flush      . terbutaline      . terbutaline  0.25 mg Subcutaneous Once   Infusions:  . lactated ringers    . oxytocin 1 milli-units/min (02/04/16 1254)   Assessment: 30 yo female arrived back from OR after diagnosed with chorioamnionitis with Temp of 100.1 po and WBC of 17. Gentamicin dosing consult ordered.  Plan:  Ordered Gentamicin  IV, which was given ~12:30.  Will order Gent level at 20:30 to determine further dosing per nomogram.    Stormy Card, RPh Clinical Pharmacist 02/04/2016,1:25 PM

## 2016-02-04 NOTE — Progress Notes (Signed)
Patient ID: Sandford Craze, female   DOB: 03/04/1986, 30 y.o.   MRN: [id number] H+P reviewed completed by [facility]. 29 G3P1111 at 20 + 1 weeks admitted with cervical dilation . Exam documented hour glassed membranes and cx approx 6 cm . Pt had previously been seen by MFM on 01/31/16 and  Sent to [facility] for tx of an untreated UTI from 12/16 . Noted to have cx shortening and was offered cervical cerclage vs 200 mg vaginal progesterone .Also on Flagyl for BV  She opted for the latter .  Elevated WBC   Without uterine tenderness.  PMHx none  PSHX none  POBX : PPROM and spontaneous neonatal loss 19 weeks  Allergies : NKDA  Meds vag suppositories , Po flagyl  VSS :  Lungs CTA  CV RRR  Adb soft NT  Utx : non tender  Pelvic exam per MS , cnm  A: cervical incompetence with advanced cervical dilation  P: I had a long discussion with the couple regarding the likelihood that a emergency cerclage will be successful . She is aware that ROM may happen at time of surgery . Subsequent infection, PPROM may require remove of the cerclage . Pt is aware that most likely she will loss this fetus .  Other risks of the procedure have been discussed with pt . All questions answered .  Prophylaxis with Unasyn and Gentamycin   Mcdonald mercilene band will be placed .

## 2016-02-05 LAB — CBC WITH DIFFERENTIAL/PLATELET
Basophils Absolute: 0 10*3/uL (ref 0–0.1)
Basophils Relative: 0 %
EOS ABS: 0.3 10*3/uL (ref 0–0.7)
EOS PCT: 2 %
HCT: 31.4 % — ABNORMAL LOW (ref 35.0–47.0)
Hemoglobin: 10.1 g/dL — ABNORMAL LOW (ref 12.0–16.0)
LYMPHS ABS: 1.3 10*3/uL (ref 1.0–3.6)
Lymphocytes Relative: 8 %
MCH: 20.8 pg — AB (ref 26.0–34.0)
MCHC: 32.2 g/dL (ref 32.0–36.0)
MCV: 64.6 fL — ABNORMAL LOW (ref 80.0–100.0)
Monocytes Absolute: 1.7 10*3/uL — ABNORMAL HIGH (ref 0.2–0.9)
Monocytes Relative: 10 %
Neutro Abs: 14 10*3/uL — ABNORMAL HIGH (ref 1.4–6.5)
Neutrophils Relative %: 80 %
PLATELETS: 232 10*3/uL (ref 150–440)
RBC: 4.86 MIL/uL (ref 3.80–5.20)
RDW: 16.1 % — ABNORMAL HIGH (ref 11.5–14.5)
WBC: 17.3 10*3/uL — AB (ref 3.6–11.0)

## 2016-02-05 LAB — CREATININE, SERUM
CREATININE: 0.8 mg/dL (ref 0.44–1.00)
GFR calc non Af Amer: >60 mL/min (ref >60)

## 2016-02-05 LAB — CBC
HEMATOCRIT: 31.4 % — AB (ref 35.0–47.0)
Hemoglobin: 9.8 g/dL — ABNORMAL LOW (ref 12.0–16.0)
MCH: 20.2 pg — ABNORMAL LOW (ref 26.0–34.0)
MCHC: 31.4 g/dL — AB (ref 32.0–36.0)
MCV: 64.5 fL — AB (ref 80.0–100.0)
PLATELETS: 213 10*3/uL (ref 150–440)
RBC: 4.87 MIL/uL (ref 3.80–5.20)
RDW: 15.9 % — AB (ref 11.5–14.5)
WBC: 18.7 10*3/uL — AB (ref 3.6–11.0)

## 2016-02-05 MED ORDER — FERROUS SULFATE 325 (65 FE) MG PO TABS: 325.0000 mg | ORAL_TABLET | Freq: Two times a day (BID) | ORAL | Status: DC

## 2016-02-05 NOTE — Discharge Summary (Signed)
Discharge instructions reviewed with patient including the need to schedule a 2 week follow up appointment at [facility], signs of postpartum depression, postpartum care and when to seek medical attention. All questions answered. Patient discharged via wheelchair in stable condition, no signs of distress observed.

## 2016-02-05 NOTE — Clinical Social Work Note (Signed)
CSW contacted by midwife to request CSW consult for patient who has had a fetal demise. CSW met with patient and her friend this afternoon and provided supportive counseling. CSW also provided information for bereavement counseling through [facility]. Patient was quiet but willing to speak with CSW. She attentively unwrapped her baby boy and then lovingly wrapped him back in his blanket as she spoke with me. Patient spoke about him with CSW for a minute and discussed how he looks like his father. Patient and friend requested taxi voucher home and CSW has provided this to them.  Shela Leff MSW,LCSW [phone]

## 2016-02-05 NOTE — Progress Notes (Signed)
PPD # 1 S/P Fetal Demise at 20+1 weeks   S:  Pt. And partner in room with fetus in bassinet              Tolerating po/ No nausea or vomiting             Bleeding is light             Pain controlled withnone             Up ad lib / ambulatory / voiding QS  O:               VS: BP 96/57 mmHg  Pulse 100  Temp(Src) 98 F (36.7 C) (Oral)  Resp 16  Ht 5' (1.524 m)  Wt 69.4 kg (153 lb)  BMI 29.88 kg/m2  SpO2 100%  LMP 09/11/2015   LABS:              Recent Labs  02/04/16 0430 02/05/16 0710  WBC 17.4* 18.7*  HGB 10.7* 9.8*  PLT 225 213               Blood type: --/--/AB POS, AB POS (02/13 0430)  Rubella:    Immune  Varicella: Immune                    I&O: Intake/Output      02/13 0701 - 02/14 0700 02/14 0701 - 02/15 0700   IV Piggyback 50    Total Intake(mL/kg) 50 (0.7)    Net +50                        Physical Exam:             Alert and oriented X3  Lungs: Clear and unlabored  Heart: regular rate and rhythm / no mumurs  Abdomen: soft, non-tender, non-distended              Fundus: firm, non-tender, U-2  Perineum: intact  Lochia: scant  Extremities: no edema, no calf pain or tenderness    A: PPD # 1, S/P Fetal Demise at 20+1 weeks   Doing well - stable status  IDA with compounding ABL Anemia   At Risk for Postpartum Depression   P: Routine post partum orders  Begin Iron Sulfate  BID with meals   Offered support and offered chaplain consult - pt. And partner decline at this time  Discussed s/s of Postpartum Depression - offered counseling and/or medication - patient declines at this time  Afebrile today - no pain   Will continue to monitor this morning and possibly discharge home this afternoon if stable   Carlean Jews, CNM

## 2016-02-06 LAB — SURGICAL PATHOLOGY

## 2016-02-07 ENCOUNTER — Ambulatory Visit: Payer: Medicare Other

## 2017-09-08 ENCOUNTER — Encounter: Payer: Medicare Other | Admitting: Maternal Newborn

## 2017-09-08 ENCOUNTER — Encounter: Payer: Self-pay | Admitting: Maternal Newborn

## 2017-09-10 ENCOUNTER — Ambulatory Visit: Payer: Medicare Other

## 2017-10-16 ENCOUNTER — Ambulatory Visit (INDEPENDENT_AMBULATORY_CARE_PROVIDER_SITE_OTHER): Payer: Medicare Other | Admitting: Maternal Newborn

## 2017-10-16 ENCOUNTER — Encounter: Payer: Self-pay | Admitting: Maternal Newborn

## 2017-10-16 ENCOUNTER — Other Ambulatory Visit (INDEPENDENT_AMBULATORY_CARE_PROVIDER_SITE_OTHER): Payer: Medicare Other

## 2017-10-16 VITALS — BP 102/62 | HR 97 | Wt 147.0 lb

## 2017-10-16 DIAGNOSIS — M545 Low back pain, unspecified: Secondary | ICD-10-CM

## 2017-10-16 DIAGNOSIS — Z23 Encounter for immunization: Secondary | ICD-10-CM | POA: Diagnosis not present

## 2017-10-16 DIAGNOSIS — R350 Frequency of micturition: Secondary | ICD-10-CM

## 2017-10-16 DIAGNOSIS — Z1379 Encounter for other screening for genetic and chromosomal anomalies: Secondary | ICD-10-CM

## 2017-10-16 DIAGNOSIS — Z362 Encounter for other antenatal screening follow-up: Secondary | ICD-10-CM | POA: Diagnosis not present

## 2017-10-16 DIAGNOSIS — O0991 Supervision of high risk pregnancy, unspecified, first trimester: Secondary | ICD-10-CM | POA: Diagnosis not present

## 2017-10-16 NOTE — Progress Notes (Signed)
10/16/2017   Chief Complaint: Amenorrhea, positive home pregnancy test, desires prenatal care.  Transfer of Care Patient: no  History of Present Illness: Ms. Maita is a 31 y.o. G4P1011 at [redacted]w[redacted]d based on Patient's last menstrual period on 07/19/2017 (exact date), with an Estimated Date of Delivery of 04/25/18, with the above CC.   Her periods were: irregular periods. She was using no method when she conceived.  She has Positive signs or symptoms of nausea/vomiting of pregnancy. She has Negative signs or symptoms of miscarriage or preterm labor She identifies Negative Zika risk factors for her and her partner On any different medications around the time she conceived/early pregnancy: No  History of varicella: No   Patient states that she is hearing impaired. She has some speech impediments.  ROS: A 12-point review of systems was performed and negative, except as stated in the above HPI.  OBGYN History: As per HPI. OB History  Gravida Para Term Preterm AB Living  4 2 1   1 1   SAB TAB Ectopic Multiple Live Births  1     0 1    # Outcome Date GA Lbr Len/2nd Weight Sex Delivery Anes PTL Lv  4 Current           3 Para 02/04/16 [redacted]w[redacted]d  9.2 oz (0.26 kg) M Vag-Spont None  FD  2 SAB 2015 [redacted]w[redacted]d   F    FD  1 Term 04/29/06 [redacted]w[redacted]d  4 lb 9 oz (2.07 kg) F Vag-Spont  N LIV      Any issues with any prior pregnancies: Yes, SGA with G1, SAB at 19 weeks with G2, demise at 20w with G3 due to cervical incompetence and chorio. Any prior children are healthy, doing well, without any problems or issues: Yes History of pap smears: Yes. Last pap smear: Patient states within last two years but cannot find record of this. History of STIs: No   Past Medical History: Past Medical History:  Diagnosis Date  . 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 History of abnormal cervical Pap smear   . Premature delivery 2014   septic abortion - chorio and UTI untreated at 19 6/7 weeks  . Preterm labor     Past  Surgical History: Past Surgical History:  Procedure Laterality Date  . COLPOSCOPY  04/2013   CIN 1    Family History:  Family History  Problem Relation Age of Onset  . Asthma Daughter   . Cancer Neg Hx   . Diabetes Neg Hx   . Stroke Neg Hx   . Thyroid disease Neg Hx   . Hypertension Neg Hx    She denies any female cancers, bleeding or blood clotting disorders.  She denies any history of intellectual disability, birth defects or genetic disorders in her or the FOB's history  Social History:  Social History   Social History  . Marital status: Single    Spouse name: N/A  . Number of children: N/A  . Years of education: N/A   Occupational History  . Not on file.   Social History Main Topics  . Smoking status: Never Smoker  . Smokeless tobacco: Never Used  . Alcohol use No  . Drug use: No  . Sexual activity: Yes   Other Topics Concern  . Not on file   Social History Narrative  . No narrative on file   Any cats in the household: no Denies history of and current domestic violence.  Allergy: No  Known Allergies  Current Outpatient Medications:  Current Outpatient Prescriptions:  .  Prenatal Vit-Fe Fumarate-FA (PRENATAL MULTIVITAMIN) TABS tablet, Take 1 tablet by mouth daily at 12 noon., Disp: , Rfl:    Physical Exam:   BP 102/62   Pulse 97   Wt 147 lb (66.7 kg)   LMP 07/19/2017 (Exact Date)   BMI 28.71 kg/m  Body mass index is 28.71 kg/m. Constitutional: Well nourished, well developed female in no acute distress.  Neck:  Supple, normal appearance, and no thyromegaly  Cardiovascular: S1, S2 normal, no murmur, rub or gallop, regular rate and rhythm Respiratory:  Clear to auscultation bilateral. Normal respiratory effort Abdomen: positive bowel sounds and no masses, hernias; diffusely non tender to palpation, non distended Breasts: breasts appear normal, no suspicious masses, no skin or nipple changes or axillary nodes. Neuro/Psych:  Normal mood and  affect.  Skin:  Warm and dry.  Lymphatic:  No inguinal lymphadenopathy.   Pelvic exam: is not limited by body habitus External genitalia, Bartholin's glands, Urethra, Skene's glands: within normal limits Vagina: within normal limits and with no blood in the vault  Cervix: normal appearing cervix without discharge or lesions, closed/long/high Uterus:  enlarged: consistent with pregnancy Adnexa:  positive for: tenderness on right side  Assessment: Ms. Yetta BarreJones is a 31 y.o. G4P1011 [id number] based on Patient's last menstrual period was 07/19/2017 (exact date). with an Estimated Date of Delivery: 04/25/18, presenting for prenatal care.  Plan:  1) Avoid alcoholic beverages. 2) Patient encouraged not to smoke.  3) Discontinue the use of all non-medicinal drugs and chemicals.  4) Take prenatal vitamins daily.  5) Seatbelt use advised 6) Nutrition, food safety (fish, cheese advisories, and high nitrite foods) and exercise discussed. 7) [facility] discussed  8) Patient is asked about travel to areas at risk for the Zika virus, and counseled to avoid travel and exposure to mosquitoes or sexual partners who may have themselves been exposed to the virus. Testing is discussed, and will be ordered as appropriate.  9) [facility] advised 10) Genetic Screening, such as with 1st Trimester Screening, cell free fetal DNA, AFP testing, and Ultrasound, as well as with amniocentesis and CVS as appropriate, is discussed with patient. She plans to have genetic testing this pregnancy.  Problem list reviewed and updated.  Unable to hear FHT today. Ultrasound done and showed single IUP with GA=dates and FHR 160 bpm.  CM saw patient today for unstable housing situation and will follow her during this pregnancy.  Return in about 4 weeks (around 11/13/2017) for ROB.  Marcelyn BruinsJacelyn Schmid, CNM [facility] 10/16/2017  1:30 PM

## 2017-10-16 NOTE — Patient Instructions (Signed)
First Trimester of Pregnancy The first trimester of pregnancy is from week 1 until the end of week 13 (months 1 through 3). A week after a sperm fertilizes an egg, the egg will implant on the wall of the uterus. This embryo will begin to develop into a baby. Genes from you and your partner will form the baby. The female genes will determine whether the baby will be a boy or a girl. At 6-8 weeks, the eyes and face will be formed, and the heartbeat can be seen on ultrasound. At the end of 12 weeks, all the baby's organs will be formed. Now that you are pregnant, you will want to do everything you can to have a healthy baby. Two of the most important things are to get good prenatal care and to follow your health care provider's instructions. Prenatal care is all the medical care you receive before the baby's birth. This care will help prevent, find, and treat any problems during the pregnancy and childbirth. Body changes during your first trimester Your body goes through many changes during pregnancy. The changes vary from woman to woman.  You may gain or lose a couple of pounds at first.  You may feel sick to your stomach (nauseous) and you may throw up (vomit). If the vomiting is uncontrollable, call your health care provider.  You may tire easily.  You may develop headaches that can be relieved by medicines. All medicines should be approved by your health care provider.  You may urinate more often. Painful urination may mean you have a bladder infection.  You may develop heartburn as a result of your pregnancy.  You may develop constipation because certain hormones are causing the muscles that push stool through your intestines to slow down.  You may develop hemorrhoids or swollen veins (varicose veins).  Your breasts may begin to grow larger and become tender. Your nipples may stick out more, and the tissue that surrounds them (areola) may become darker.  Your gums may bleed and may be  sensitive to brushing and flossing.  Dark spots or blotches (chloasma, mask of pregnancy) may develop on your face. This will likely fade after the baby is born.  Your menstrual periods will stop.  You may have a loss of appetite.  You may develop cravings for certain kinds of food.  You may have changes in your emotions from day to day, such as being excited to be pregnant or being concerned that something may go wrong with the pregnancy and baby.  You may have more vivid and strange dreams.  You may have changes in your hair. These can include thickening of your hair, rapid growth, and changes in texture. Some women also have hair loss during or after pregnancy, or hair that feels dry or thin. Your hair will most likely return to normal after your baby is born.  What to expect at prenatal visits During a routine prenatal visit:  You will be weighed to make sure you and the baby are growing normally.  Your blood pressure will be taken.  Your abdomen will be measured to track your baby's growth.  The fetal heartbeat will be listened to between weeks 10 and 14 of your pregnancy.  Test results from any previous visits will be discussed.  Your health care provider may ask you:  How you are feeling.  If you are feeling the baby move.  If you have had any abnormal symptoms, such as leaking fluid, bleeding, severe headaches,   or abdominal cramping.  If you are using any tobacco products, including cigarettes, chewing tobacco, and electronic cigarettes.  If you have any questions.  Other tests that may be performed during your first trimester include:  Blood tests to find your blood type and to check for the presence of any previous infections. The tests will also be used to check for low iron levels (anemia) and protein on red blood cells (Rh antibodies). Depending on your risk factors, or if you previously had diabetes during pregnancy, you may have tests to check for high blood  sugar that affects pregnant women (gestational diabetes).  Urine tests to check for infections, diabetes, or protein in the urine.  An ultrasound to confirm the proper growth and development of the baby.  Fetal screens for spinal cord problems (spina bifida) and Down syndrome.  HIV (human immunodeficiency virus) testing. Routine prenatal testing includes screening for HIV, unless you choose not to have this test.  You may need other tests to make sure you and the baby are doing well.  Follow these instructions at home: Medicines  Follow your health care provider's instructions regarding medicine use. Specific medicines may be either safe or unsafe to take during pregnancy.  Take a prenatal vitamin that contains at least 600 micrograms (mcg) of folic acid.  If you develop constipation, try taking a stool softener if your health care provider approves. Eating and drinking  Eat a balanced diet that includes fresh fruits and vegetables, whole grains, good sources of protein such as meat, eggs, or tofu, and low-fat dairy. Your health care provider will help you determine the amount of weight gain that is right for you.  Avoid raw meat and uncooked cheese. These carry germs that can cause birth defects in the baby.  Eating four or five small meals rather than three large meals a day may help relieve nausea and vomiting. If you start to feel nauseous, eating a few soda crackers can be helpful. Drinking liquids between meals, instead of during meals, also seems to help ease nausea and vomiting.  Limit foods that are high in fat and processed sugars, such as fried and sweet foods.  To prevent constipation: ? Eat foods that are high in fiber, such as fresh fruits and vegetables, whole grains, and beans. ? Drink enough fluid to keep your urine clear or pale yellow. Activity  Exercise only as directed by your health care provider. Most women can continue their usual exercise routine during  pregnancy. Try to exercise for 30 minutes at least 5 days a week. Exercising will help you: ? Control your weight. ? Stay in shape. ? Be prepared for labor and delivery.  Experiencing pain or cramping in the lower abdomen or lower back is a good sign that you should stop exercising. Check with your health care provider before continuing with normal exercises.  Try to avoid standing for long periods of time. Move your legs often if you must stand in one place for a long time.  Avoid heavy lifting.  Wear low-heeled shoes and practice good posture.  You may continue to have sex unless your health care provider tells you not to. Relieving pain and discomfort  Wear a good support bra to relieve breast tenderness.  Take warm sitz baths to soothe any pain or discomfort caused by hemorrhoids. Use hemorrhoid cream if your health care provider approves.  Rest with your legs elevated if you have leg cramps or low back pain.  If you develop   varicose veins in your legs, wear support hose. Elevate your feet for 15 minutes, 3-4 times a day. Limit salt in your diet. Prenatal care  Schedule your prenatal visits by the twelfth week of pregnancy. They are usually scheduled monthly at first, then more often in the last 2 months before delivery.  Write down your questions. Take them to your prenatal visits.  Keep all your prenatal visits as told by your health care provider. This is important. Safety  Wear your seat belt at all times when driving.  Make a list of emergency phone numbers, including numbers for family, friends, the hospital, and police and fire departments. General instructions  Ask your health care provider for a referral to a local prenatal education class. Begin classes no later than the beginning of month 6 of your pregnancy.  Ask for help if you have counseling or nutritional needs during pregnancy. Your health care provider can offer advice or refer you to specialists for help  with various needs.  Do not use hot tubs, steam rooms, or saunas.  Do not douche or use tampons or scented sanitary pads.  Do not cross your legs for long periods of time.  Avoid cat litter boxes and soil used by cats. These carry germs that can cause birth defects in the baby and possibly loss of the fetus by miscarriage or stillbirth.  Avoid all smoking, herbs, alcohol, and medicines not prescribed by your health care provider. Chemicals in these products affect the formation and growth of the baby.  Do not use any products that contain nicotine or tobacco, such as cigarettes and e-cigarettes. If you need help quitting, ask your health care provider. You may receive counseling support and other resources to help you quit.  Schedule a dentist appointment. At home, brush your teeth with a soft toothbrush and be gentle when you floss. Contact a health care provider if:  You have dizziness.  You have mild pelvic cramps, pelvic pressure, or nagging pain in the abdominal area.  You have persistent nausea, vomiting, or diarrhea.  You have a bad smelling vaginal discharge.  You have pain when you urinate.  You notice increased swelling in your face, hands, legs, or ankles.  You are exposed to fifth disease or chickenpox.  You are exposed to German measles (rubella) and have never had it. Get help right away if:  You have a fever.  You are leaking fluid from your vagina.  You have spotting or bleeding from your vagina.  You have severe abdominal cramping or pain.  You have rapid weight gain or loss.  You vomit blood or material that looks like coffee grounds.  You develop a severe headache.  You have shortness of breath.  You have any kind of trauma, such as from a fall or a car accident. Summary  The first trimester of pregnancy is from week 1 until the end of week 13 (months 1 through 3).  Your body goes through many changes during pregnancy. The changes vary from  woman to woman.  You will have routine prenatal visits. During those visits, your health care provider will examine you, discuss any test results you may have, and talk with you about how you are feeling. This information is not intended to replace advice given to you by your health care provider. Make sure you discuss any questions you have with your health care provider. Document Released: 12/02/2001 Document Revised: 11/19/2016 Document Reviewed: 11/19/2016 Elsevier Interactive Patient Education  2017 Elsevier   Inc.  

## 2017-10-17 LAB — RPR+RH+ABO+RUB AB+AB SCR+CB...
ANTIBODY SCREEN: NEGATIVE
HIV SCREEN 4TH GENERATION: NONREACTIVE
Hematocrit: 34.9 % (ref 34.0–46.6)
Hemoglobin: 10.8 g/dL — ABNORMAL LOW (ref 11.1–15.9)
Hepatitis B Surface Ag: NEGATIVE
MCH: 21 pg — ABNORMAL LOW (ref 26.6–33.0)
MCHC: 30.9 g/dL — ABNORMAL LOW (ref 31.5–35.7)
MCV: 68 fL — ABNORMAL LOW (ref 79–97)
PLATELETS: 233 10*3/uL (ref 150–379)
RBC: 5.14 x10E6/uL (ref 3.77–5.28)
RDW: 16.1 % — AB (ref 12.3–15.4)
RPR: NONREACTIVE
RUBELLA: 2.01 {index} (ref >0.99)
Rh Factor: POSITIVE
VARICELLA: 416 {index} (ref >165)
WBC: 15.3 10*3/uL — ABNORMAL HIGH (ref 3.4–10.8)

## 2017-10-19 ENCOUNTER — Other Ambulatory Visit: Payer: Self-pay | Admitting: Maternal Newborn

## 2017-10-19 DIAGNOSIS — O2620 Pregnancy care for patient with recurrent pregnancy loss, unspecified trimester: Secondary | ICD-10-CM

## 2017-10-19 DIAGNOSIS — B961 Klebsiella pneumoniae [K. pneumoniae] as the cause of diseases classified elsewhere: Secondary | ICD-10-CM

## 2017-10-19 DIAGNOSIS — B9689 Other specified bacterial agents as the cause of diseases classified elsewhere: Secondary | ICD-10-CM

## 2017-10-19 DIAGNOSIS — N390 Urinary tract infection, site not specified: Secondary | ICD-10-CM | POA: Insufficient documentation

## 2017-10-19 DIAGNOSIS — O99019 Anemia complicating pregnancy, unspecified trimester: Secondary | ICD-10-CM | POA: Insufficient documentation

## 2017-10-19 LAB — URINE CULTURE

## 2017-10-19 MED ORDER — FERROUS SULFATE 325 (65 FE) MG PO TABS: 325.0000 mg | ORAL_TABLET | Freq: Two times a day (BID) | ORAL | 1 refills | Status: AC

## 2017-10-19 MED ORDER — CEPHALEXIN 500 MG PO CAPS: 500.0000 mg | ORAL_CAPSULE | Freq: Four times a day (QID) | ORAL | 0 refills | Status: AC

## 2017-10-19 NOTE — Progress Notes (Signed)
Left message for patient to call back. Sent Rx for Keflex to treat Klebsiella UTI and Fe for anemia. Will also inform patient re: DP consult when she returns call.  Marcelyn BruinsJacelyn Morio Widen, CNM 10/19/2017  6:31 PM

## 2017-10-20 LAB — URINE DRUG PANEL 7
AMPHETAMINES, URINE: NEGATIVE ng/mL
Barbiturate Quant, Ur: NEGATIVE ng/mL
Benzodiazepine Quant, Ur: NEGATIVE ng/mL
CANNABINOID QUANT UR: NEGATIVE ng/mL
COCAINE (METAB.): NEGATIVE ng/mL
OPIATE QUANT UR: NEGATIVE ng/mL
PCP QUANT UR: NEGATIVE ng/mL

## 2017-10-20 LAB — GC/CHLAMYDIA PROBE AMP
Chlamydia trachomatis, NAA: NEGATIVE
Neisseria gonorrhoeae by PCR: NEGATIVE

## 2017-10-21 ENCOUNTER — Telehealth: Payer: Self-pay | Admitting: Maternal Newborn

## 2017-10-21 LAB — MATERNIT 21 PLUS CORE, BLOOD
Chromosome 13: NEGATIVE
Chromosome 18: NEGATIVE
Chromosome 21: NEGATIVE
Y Chromosome: NOT DETECTED

## 2017-10-21 NOTE — Telephone Encounter (Signed)
Called patient again as earlier message was not returned. Patient now aware that she needs to pick up Rx for Keflex and ferrous sulfate to treat Klebsiella UTI and anemia. Also [facility] management of this pregnancy in light of two demises in second trimester.  Marcelyn BruinsJacelyn Schmid, CNM 10/21/2017  11:37 AM

## 2017-10-23 ENCOUNTER — Telehealth: Payer: Self-pay

## 2017-10-23 NOTE — Telephone Encounter (Signed)
Pt calling to verify gender of baby. [phone]  Pt aware it is a girl.

## 2017-11-02 ENCOUNTER — Other Ambulatory Visit: Payer: Self-pay | Admitting: *Deleted

## 2017-11-02 DIAGNOSIS — N883 Incompetence of cervix uteri: Secondary | ICD-10-CM

## 2017-11-05 ENCOUNTER — Ambulatory Visit: Payer: Medicare Other

## 2017-11-05 ENCOUNTER — Inpatient Hospital Stay: Admission: RE | Admit: 2017-11-05 | Payer: Medicare Other | Source: Ambulatory Visit

## 2017-11-16 ENCOUNTER — Other Ambulatory Visit: Payer: Self-pay | Admitting: *Deleted

## 2017-11-16 ENCOUNTER — Ambulatory Visit (INDEPENDENT_AMBULATORY_CARE_PROVIDER_SITE_OTHER): Payer: Medicare Other | Admitting: Obstetrics and Gynecology

## 2017-11-16 ENCOUNTER — Encounter: Payer: Self-pay | Admitting: Obstetrics and Gynecology

## 2017-11-16 VITALS — BP 114/78 | Wt 153.0 lb

## 2017-11-16 DIAGNOSIS — O09292 Supervision of pregnancy with other poor reproductive or obstetric history, second trimester: Secondary | ICD-10-CM

## 2017-11-16 DIAGNOSIS — N390 Urinary tract infection, site not specified: Secondary | ICD-10-CM

## 2017-11-16 DIAGNOSIS — B961 Klebsiella pneumoniae [K. pneumoniae] as the cause of diseases classified elsewhere: Secondary | ICD-10-CM

## 2017-11-16 DIAGNOSIS — B9689 Other specified bacterial agents as the cause of diseases classified elsewhere: Secondary | ICD-10-CM

## 2017-11-16 DIAGNOSIS — Z8759 Personal history of other complications of pregnancy, childbirth and the puerperium: Secondary | ICD-10-CM

## 2017-11-16 DIAGNOSIS — Z124 Encounter for screening for malignant neoplasm of cervix: Secondary | ICD-10-CM

## 2017-11-16 DIAGNOSIS — Z3A17 17 weeks gestation of pregnancy: Secondary | ICD-10-CM

## 2017-11-16 DIAGNOSIS — O3432 Maternal care for cervical incompetence, second trimester: Secondary | ICD-10-CM

## 2017-11-16 DIAGNOSIS — O0990 Supervision of high risk pregnancy, unspecified, unspecified trimester: Secondary | ICD-10-CM

## 2017-11-16 DIAGNOSIS — N883 Incompetence of cervix uteri: Secondary | ICD-10-CM

## 2017-11-16 NOTE — Progress Notes (Signed)
Routine Prenatal Care Visit  Subjective  Alexis Mercado is a 31 y.o. G4P1011 at 1327w1d being seen today for ongoing prenatal care.  She is currently monitored for the following issues for this high-risk pregnancy and has History of pregnancy loss in prior pregnancy, currently pregnant in second trimester; Prior pregnancy complicated by SGA (small for gestational age), antepartum; Short cervix affecting pregnancy; Premature cervical dilation in second trimester; Cervical incompetence; UTI due to Klebsiella species; Anemia affecting fourth pregnancy; and Supervision of high risk pregnancy, antepartum on their problem list.  ----------------------------------------------------------------------------------- Patient reports no complaints.    . Vag. Bleeding: None.  Movement: Present. Denies leaking of fluid.  Appt with [facility] in 3 days. States she will keep. No symptoms today.  ----------------------------------------------------------------------------------- The following portions of the patient's history were reviewed and updated as appropriate: allergies, current medications, past family history, past medical history, past social history, past surgical history and problem list. Problem list updated.   Objective  Blood pressure 114/78, weight 153 lb (69.4 kg), last menstrual period 07/19/2017. Pregravid weight 146 lb (66.2 kg) Total Weight Gain 7 lb (3.175 kg) Urinalysis:      Fetal Status: Fetal Heart Rate (bpm): 145   Movement: Present     General:  Alert, oriented and cooperative. Patient is in no acute distress.  Skin: Skin is warm and dry. No rash noted.   Cardiovascular: Normal heart rate noted  Respiratory: Normal respiratory effort, no problems with respiration noted  Abdomen: Soft, gravid, appropriate for gestational age. Pain/Pressure: Absent     Pelvic:  Cervical exam deferred        Extremities: Normal range of motion.     Mental Status: Normal mood and affect. Normal behavior.  Normal judgment and thought content.   Assessment   31 y.o. G4P1011 at 8527w1d by  04/25/2018, by Last Menstrual Period presenting for routine prenatal visit  Plan   pregnancy #4 Problems (from 10/16/17 to present)    Problem Noted Resolved   Supervision of high risk pregnancy, antepartum 11/16/2017 by Conard NovakJackson, Shalaya Swailes D, MD No   Overview Signed 11/16/2017 12:11 PM by Conard NovakJackson, Purity Irmen D, MD    Clinic [facility] Prenatal Labs  Dating  Blood type: AB/Positive/-- (10/26 1439)   Genetic Screen Quad:     NIPS: diploid XX Antibody:Negative (10/26 1439)  Anatomic US  Rubella: 2.01 (10/26 1439) Varicella: @VZVIGG @  GTT Third trimester:  RPR: Non Reactive (10/26 1439)   Rhogam n/a HBsAg: Negative (10/26 1439)   TDaP vaccine                       Flu Shot: HIV:   neg  Baby Food                                GBS:   Contraception  Pap: pending 11/26  CBB     CS/VBAC    Support Person         UTI due to Klebsiella species 10/19/2017 by Oswaldo ConroySchmid, Jacelyn Y, CNM No   Overview Signed 11/16/2017 12:10 PM by Conard NovakJackson, Serafina Topham D, MD    [ ]  TOC 11/26 - collected. UCx sent      Anemia affecting fourth pregnancy 10/19/2017 by Oswaldo ConroySchmid, Jacelyn Y, CNM No   Cervical incompetence 02/04/2016 by Schermerhorn, Ihor Austinhomas J, MD No   Overview Signed 11/16/2017 12:09 PM by Conard NovakJackson, Honour Schwieger D, MD    Appt with [facility] PN for  management recommendations, cerclage placement, if needed.  Patient has no-showed for a few appointments.  Next is 11/29.  States she will keep.  She has anatomy ultrasound that appt, as well.       History of pregnancy loss in prior pregnancy, currently pregnant in second trimester 01/31/2016 by Kirby FunkEllestad, Sarah, MD No   Overview Addendum 11/16/2017 12:10 PM by Conard NovakJackson, Darianna Amy D, MD    Hx of 19 week PPROM/delivery in 2015 [ ]  [facility] PN consult now that has happened twice         Please refer to After Visit Summary for other counseling recommendations.   Return in about 2 weeks (around 11/30/2017) for  Routine Prenatal Appointment.  Thomasene MohairStephen Tymia Streb, MD  11/16/2017 12:08 PM

## 2017-11-18 LAB — URINE CULTURE

## 2017-11-18 LAB — IGP, APTIMA HPV, RFX 16/18,45
HPV Aptima: NEGATIVE
PAP Smear Comment: 0

## 2017-11-19 ENCOUNTER — Ambulatory Visit
Admission: RE | Admit: 2017-11-19 | Discharge: 2017-11-19 | Disposition: A | Discharge status: Home / Self Care | Payer: Medicare Other | Source: Ambulatory Visit | Attending: Maternal & Fetal Medicine | Admitting: Maternal & Fetal Medicine

## 2017-11-19 ENCOUNTER — Other Ambulatory Visit: Payer: Self-pay | Admitting: Maternal & Fetal Medicine

## 2017-11-19 DIAGNOSIS — O3432 Maternal care for cervical incompetence, second trimester: Secondary | ICD-10-CM | POA: Diagnosis not present

## 2017-11-19 DIAGNOSIS — Z3A17 17 weeks gestation of pregnancy: Secondary | ICD-10-CM | POA: Diagnosis not present

## 2017-11-19 DIAGNOSIS — N883 Incompetence of cervix uteri: Secondary | ICD-10-CM

## 2017-11-19 DIAGNOSIS — Z8759 Personal history of other complications of pregnancy, childbirth and the puerperium: Secondary | ICD-10-CM

## 2017-11-19 HISTORY — DX: Depression, unspecified: F32.A

## 2017-11-19 HISTORY — DX: Major depressive disorder, single episode, unspecified: F32.9

## 2017-11-23 ENCOUNTER — Other Ambulatory Visit: Payer: Self-pay | Admitting: *Deleted

## 2017-11-23 DIAGNOSIS — Z8751 Personal history of pre-term labor: Secondary | ICD-10-CM

## 2017-11-26 ENCOUNTER — Encounter: Payer: Self-pay | Admitting: Obstetrics and Gynecology

## 2017-11-26 ENCOUNTER — Ambulatory Visit
Admission: RE | Admit: 2017-11-26 | Discharge: 2017-11-26 | Disposition: A | Discharge status: Home / Self Care | Payer: Medicare Other | Source: Ambulatory Visit | Attending: Obstetrics and Gynecology | Admitting: Obstetrics and Gynecology

## 2017-11-26 DIAGNOSIS — N883 Incompetence of cervix uteri: Secondary | ICD-10-CM

## 2017-11-26 DIAGNOSIS — O0990 Supervision of high risk pregnancy, unspecified, unspecified trimester: Secondary | ICD-10-CM

## 2017-11-26 DIAGNOSIS — Z3A18 18 weeks gestation of pregnancy: Secondary | ICD-10-CM | POA: Diagnosis not present

## 2017-11-26 DIAGNOSIS — B9689 Other specified bacterial agents as the cause of diseases classified elsewhere: Secondary | ICD-10-CM

## 2017-11-26 DIAGNOSIS — B961 Klebsiella pneumoniae [K. pneumoniae] as the cause of diseases classified elsewhere: Secondary | ICD-10-CM

## 2017-11-26 DIAGNOSIS — Z8751 Personal history of pre-term labor: Secondary | ICD-10-CM | POA: Insufficient documentation

## 2017-11-26 DIAGNOSIS — O09292 Supervision of pregnancy with other poor reproductive or obstetric history, second trimester: Secondary | ICD-10-CM

## 2017-11-26 DIAGNOSIS — O99019 Anemia complicating pregnancy, unspecified trimester: Secondary | ICD-10-CM

## 2017-11-26 DIAGNOSIS — N390 Urinary tract infection, site not specified: Secondary | ICD-10-CM

## 2017-11-26 NOTE — Progress Notes (Signed)
11/26/17   Attention Transportation Department  Fax [phone]  [facility]  [facility] [place] Department [place] and [place].     Please do not hesitate to contact me if you have further questions or concerns.   Sincerely,  Alexis PandyMaria Lorance Pickeral MD [phone]

## 2017-11-30 ENCOUNTER — Encounter: Payer: Medicare Other | Admitting: Maternal Newborn

## 2017-12-03 ENCOUNTER — Encounter: Payer: Medicare Other | Admitting: Obstetrics and Gynecology

## 2017-12-04 ENCOUNTER — Ambulatory Visit (INDEPENDENT_AMBULATORY_CARE_PROVIDER_SITE_OTHER): Payer: Medicare Other | Admitting: Obstetrics and Gynecology

## 2017-12-04 VITALS — BP 122/62 | Wt 156.0 lb

## 2017-12-04 DIAGNOSIS — O99019 Anemia complicating pregnancy, unspecified trimester: Secondary | ICD-10-CM

## 2017-12-04 DIAGNOSIS — Z3A19 19 weeks gestation of pregnancy: Secondary | ICD-10-CM

## 2017-12-04 DIAGNOSIS — O3432 Maternal care for cervical incompetence, second trimester: Secondary | ICD-10-CM

## 2017-12-04 DIAGNOSIS — O26879 Cervical shortening, unspecified trimester: Secondary | ICD-10-CM

## 2017-12-04 DIAGNOSIS — N883 Incompetence of cervix uteri: Secondary | ICD-10-CM

## 2017-12-04 DIAGNOSIS — O0990 Supervision of high risk pregnancy, unspecified, unspecified trimester: Secondary | ICD-10-CM

## 2017-12-04 NOTE — Progress Notes (Signed)
Routine Prenatal Care Visit  Subjective  Alexis Mercado is a 31 y.o. G4P1011 at 8166w5d being seen today for ongoing prenatal care.  She is currently monitored for the following issues for this high-risk pregnancy and has History of pregnancy loss in prior pregnancy, currently pregnant in second trimester; Prior pregnancy complicated by SGA (small for gestational age), antepartum; Short cervix affecting pregnancy; Premature cervical dilation in second trimester; Cervical incompetence; UTI due to Klebsiella species; Anemia affecting fourth pregnancy; and Supervision of high risk pregnancy, antepartum on their problem list.  ----------------------------------------------------------------------------------- Patient reports that she had a cerclage placed on 11/26/17.   Contractions: Not present. Vag. Bleeding: None.  Movement: Present. Denies leaking of fluid.     Objective   Vitals:   12/04/17 1105  BP: 122/62  Weight: 156 lb (70.8 kg)   Pregravid Weight: 146 lb (66.2 kg)  Total Weight Gain: 10 lb (4.536 kg)  Urinalysis:      Fetal Status: Fetal Heart Rate (bpm): 155   Movement: Present     General: Alert: Oriented and cooperative. Patient is in no acute distress. Skin: Skin is warm and dry. No rash noted.  Cardiovascular: Regular rate and rhythm. No murmurs, gallops, or rubs. Respiratory: Normal respiratory effort, no problems with respiration noted Abdomen: Soft, gravid, appropriate for gestational age. Pain/Pressure: Absent Pelvic: Cervical exam deferred       Extremeties: Normal range of motion.    Mental Status: Normal mood and affect. Normal behavior. Normal judgment and thought content.  Assessment   31 y.o. G4P1011 at [redacted]w[redacted]d by  04/25/2018, by Last Menstrual Period presenting for routine prenatal visit  Plan   pregnancy #4 Problems (from 10/16/17 to present)    Problem Noted Resolved   Supervision of high risk pregnancy, antepartum 11/16/2017 by Conard NovakJackson, Stephen D, MD  No   Overview Addendum 12/04/2017 12:03 PM by Natale MilchSchuman, Cleston Lautner R, MD    Clinic [facility] Prenatal Labs  Dating LMP =12wk US Blood type: AB/Positive/-- (10/26 1439)   Genetic Screen NIPS: diploid XX Antibody:Negative (10/26 1439)  Anatomic US  normal Rubella: 2.01 (10/26 1439) Varicella: @VZVIGG @  GTT Third trimester:  RPR: Non Reactive (10/26 1439)   Rhogam n/a HBsAg: Negative (10/26 1439)   TDaP vaccine                       Flu Shot: HIV:   neg  Baby Food                                GBS:   Contraception  Pap: ASCUS HPV NEG 11/16/17  CBB     CS/VBAC    Support Person            UTI due to Klebsiella species 10/19/2017 by Oswaldo ConroySchmid, Jacelyn Y, CNM No   Overview Signed 11/16/2017 12:10 PM by Conard NovakJackson, Stephen D, MD    [ ]  TOC 11/26 - collected. UCx sent      Anemia affecting fourth pregnancy 10/19/2017 by Oswaldo ConroySchmid, Jacelyn Y, CNM No   Cervical incompetence 02/04/2016 by Schermerhorn, Ihor Austinhomas J, MD No   Overview Signed 11/16/2017 12:09 PM by Conard NovakJackson, Stephen D, MD    Appt with [facility] PN for management recommendations, cerclage placement, if needed.  Patient has no-showed for a few appointments.  Next is 11/29.  States she will keep.  She has anatomy ultrasound that appt, as well.  History of pregnancy loss in prior pregnancy, currently pregnant in second trimester 01/31/2016 by Kirby FunkEllestad, Sarah, MD No   Overview Addendum 11/16/2017 12:10 PM by Conard NovakJackson, Stephen D, MD    Hx of 19 week PPROM/delivery in 2015 [ ]  [facility] PN consult now that has happened twice           Preterm labor symptoms and general obstetric precautions including but not limited to vaginal bleeding, contractions, leaking of fluid and fetal movement were reviewed in detail with the patient.  Discussed patient with Jola BabinskiMarilyn. She has a substandard living situation which Jola BabinskiMarilyn is advising the patient on how to seek help for improvement  Recent cerclage placement, patient doing well, no issues.  Please refer to  After Visit Summary for other counseling recommendations.   Return in about 4 weeks (around 01/01/2018) for rob.  Adelene Idlerhristanna Attila Mccarthy M.D. 12/04/17 12:08 PM

## 2017-12-04 NOTE — Progress Notes (Signed)
[facility] Pt reports no problems.

## 2017-12-28 ENCOUNTER — Other Ambulatory Visit: Payer: Self-pay | Admitting: *Deleted

## 2017-12-28 DIAGNOSIS — N883 Incompetence of cervix uteri: Secondary | ICD-10-CM

## 2017-12-31 ENCOUNTER — Ambulatory Visit
Admission: RE | Admit: 2017-12-31 | Discharge: 2017-12-31 | Disposition: A | Discharge status: Home / Self Care | Payer: Medicare Other | Source: Ambulatory Visit | Attending: Obstetrics & Gynecology | Admitting: Obstetrics & Gynecology

## 2017-12-31 ENCOUNTER — Ambulatory Visit: Admission: RE | Admit: 2017-12-31 | Payer: Medicare Other | Source: Ambulatory Visit

## 2017-12-31 DIAGNOSIS — O99019 Anemia complicating pregnancy, unspecified trimester: Secondary | ICD-10-CM

## 2017-12-31 DIAGNOSIS — O365990 Maternal care for other known or suspected poor fetal growth, unspecified trimester, not applicable or unspecified: Secondary | ICD-10-CM

## 2017-12-31 DIAGNOSIS — O09892 Supervision of other high risk pregnancies, second trimester: Secondary | ICD-10-CM | POA: Insufficient documentation

## 2017-12-31 DIAGNOSIS — O09292 Supervision of pregnancy with other poor reproductive or obstetric history, second trimester: Secondary | ICD-10-CM | POA: Insufficient documentation

## 2017-12-31 DIAGNOSIS — O0990 Supervision of high risk pregnancy, unspecified, unspecified trimester: Secondary | ICD-10-CM

## 2017-12-31 DIAGNOSIS — O3432 Maternal care for cervical incompetence, second trimester: Secondary | ICD-10-CM | POA: Insufficient documentation

## 2017-12-31 DIAGNOSIS — Z362 Encounter for other antenatal screening follow-up: Secondary | ICD-10-CM | POA: Diagnosis present

## 2017-12-31 DIAGNOSIS — N883 Incompetence of cervix uteri: Secondary | ICD-10-CM

## 2017-12-31 DIAGNOSIS — B9689 Other specified bacterial agents as the cause of diseases classified elsewhere: Secondary | ICD-10-CM

## 2017-12-31 DIAGNOSIS — Z3A23 23 weeks gestation of pregnancy: Secondary | ICD-10-CM | POA: Insufficient documentation

## 2017-12-31 DIAGNOSIS — B961 Klebsiella pneumoniae [K. pneumoniae] as the cause of diseases classified elsewhere: Secondary | ICD-10-CM

## 2017-12-31 DIAGNOSIS — N390 Urinary tract infection, site not specified: Secondary | ICD-10-CM

## 2018-01-01 ENCOUNTER — Ambulatory Visit (INDEPENDENT_AMBULATORY_CARE_PROVIDER_SITE_OTHER): Payer: Medicare Other | Admitting: Obstetrics & Gynecology

## 2018-01-01 VITALS — BP 110/60 | Wt 160.0 lb

## 2018-01-01 DIAGNOSIS — O3432 Maternal care for cervical incompetence, second trimester: Secondary | ICD-10-CM

## 2018-01-01 DIAGNOSIS — O0990 Supervision of high risk pregnancy, unspecified, unspecified trimester: Secondary | ICD-10-CM

## 2018-01-01 DIAGNOSIS — Z3A23 23 weeks gestation of pregnancy: Secondary | ICD-10-CM

## 2018-01-01 NOTE — Progress Notes (Signed)
Prenatal Visit Note Date: 01/01/2018 Clinic: [facility]  Subjective:  Alexis Mercado is a 32 y.o. G4P1011 at 6724w5d being seen today for ongoing prenatal care.  She is currently monitored for the following issues for this high-risk pregnancy and has History of pregnancy loss in prior pregnancy, currently pregnant in second trimester; Prior pregnancy complicated by SGA (small for gestational age), antepartum; Short cervix affecting pregnancy; Premature cervical dilation in second trimester; Cervical incompetence; UTI due to Klebsiella species; Anemia affecting fourth pregnancy; Supervision of high risk pregnancy, antepartum; and Pregnancy affected by fetal growth restriction on their problem list.  Patient reports no complaints.   Contractions: Not present. Vag. Bleeding: None.  Movement: Present. Denies leaking of fluid.   The following portions of the patient's history were reviewed and updated as appropriate: allergies, current medications, past family history, past medical history, past social history, past surgical history and problem list. Problem list updated.  Objective:   Vitals:   01/01/18 0851  BP: 110/60  Weight: 160 lb (72.6 kg)    Fetal Status:     Movement: Present     General:  Alert, oriented and cooperative. Patient is in no acute distress.  Skin: Skin is warm and dry. No rash noted.   Cardiovascular: Normal heart rate noted  Respiratory: Normal respiratory effort, no problems with respiration noted  Abdomen: Soft, gravid, appropriate for gestational age. Pain/Pressure: Absent     Pelvic:  Cervical exam deferred        Extremities: Normal range of motion.     Mental Status: Normal mood and affect. Normal behavior. Normal judgment and thought content.   Urinalysis: Urine Protein: Negative Urine Glucose: Negative  Assessment and Plan:  Pregnancy: G4P1011 at 5324w5d  1. [redacted] weeks gestation of pregnancy  2. Premature cervical dilation in second trimester CERCLAGE in  place; no sx's of concern  3. Supervision of high risk pregnancy, antepartum DP for IUGR  Preterm labor symptoms and general obstetric precautions including but not limited to vaginal bleeding, contractions, leaking of fluid and fetal movement were reviewed in detail with the patient. Please refer to After Visit Summary for other counseling recommendations.  Sees DP WEEKLY for APT related to IUGR; Cerclage Return in about 2 weeks (around 01/15/2018) for HROB.  Annamarie MajorPaul Njeri Vicente, MD, Merlinda FrederickFACOG [facility], [facility] 01/01/2018  9:18 AM

## 2018-01-01 NOTE — Patient Instructions (Signed)

## 2018-01-04 ENCOUNTER — Ambulatory Visit
Admission: RE | Admit: 2018-01-04 | Discharge: 2018-01-04 | Disposition: A | Discharge status: Home / Self Care | Payer: Medicare Other | Source: Ambulatory Visit | Attending: Obstetrics & Gynecology | Admitting: Obstetrics & Gynecology

## 2018-01-04 DIAGNOSIS — O3432 Maternal care for cervical incompetence, second trimester: Secondary | ICD-10-CM | POA: Insufficient documentation

## 2018-01-04 DIAGNOSIS — O0990 Supervision of high risk pregnancy, unspecified, unspecified trimester: Secondary | ICD-10-CM

## 2018-01-04 DIAGNOSIS — O365920 Maternal care for other known or suspected poor fetal growth, second trimester, not applicable or unspecified: Secondary | ICD-10-CM | POA: Insufficient documentation

## 2018-01-04 DIAGNOSIS — N390 Urinary tract infection, site not specified: Secondary | ICD-10-CM

## 2018-01-04 DIAGNOSIS — O09292 Supervision of pregnancy with other poor reproductive or obstetric history, second trimester: Secondary | ICD-10-CM | POA: Diagnosis not present

## 2018-01-04 DIAGNOSIS — Z3A24 24 weeks gestation of pregnancy: Secondary | ICD-10-CM | POA: Diagnosis not present

## 2018-01-04 DIAGNOSIS — N883 Incompetence of cervix uteri: Secondary | ICD-10-CM

## 2018-01-04 DIAGNOSIS — O365990 Maternal care for other known or suspected poor fetal growth, unspecified trimester, not applicable or unspecified: Secondary | ICD-10-CM | POA: Diagnosis present

## 2018-01-04 DIAGNOSIS — O99019 Anemia complicating pregnancy, unspecified trimester: Secondary | ICD-10-CM

## 2018-01-04 DIAGNOSIS — B961 Klebsiella pneumoniae [K. pneumoniae] as the cause of diseases classified elsewhere: Secondary | ICD-10-CM

## 2018-01-07 ENCOUNTER — Other Ambulatory Visit: Payer: Self-pay | Admitting: Maternal & Fetal Medicine

## 2018-01-07 ENCOUNTER — Ambulatory Visit
Admission: RE | Admit: 2018-01-07 | Discharge: 2018-01-07 | Disposition: A | Discharge status: Home / Self Care | Payer: Medicare Other | Source: Ambulatory Visit | Attending: Maternal & Fetal Medicine | Admitting: Maternal & Fetal Medicine

## 2018-01-07 ENCOUNTER — Other Ambulatory Visit: Payer: Self-pay | Admitting: *Deleted

## 2018-01-07 DIAGNOSIS — N390 Urinary tract infection, site not specified: Secondary | ICD-10-CM

## 2018-01-07 DIAGNOSIS — O365920 Maternal care for other known or suspected poor fetal growth, second trimester, not applicable or unspecified: Secondary | ICD-10-CM

## 2018-01-07 DIAGNOSIS — Z3A24 24 weeks gestation of pregnancy: Secondary | ICD-10-CM | POA: Diagnosis not present

## 2018-01-07 DIAGNOSIS — O365990 Maternal care for other known or suspected poor fetal growth, unspecified trimester, not applicable or unspecified: Secondary | ICD-10-CM

## 2018-01-07 DIAGNOSIS — O3432 Maternal care for cervical incompetence, second trimester: Secondary | ICD-10-CM | POA: Diagnosis present

## 2018-01-07 DIAGNOSIS — O0992 Supervision of high risk pregnancy, unspecified, second trimester: Secondary | ICD-10-CM | POA: Diagnosis not present

## 2018-01-07 DIAGNOSIS — O99019 Anemia complicating pregnancy, unspecified trimester: Secondary | ICD-10-CM

## 2018-01-07 DIAGNOSIS — B961 Klebsiella pneumoniae [K. pneumoniae] as the cause of diseases classified elsewhere: Secondary | ICD-10-CM

## 2018-01-07 DIAGNOSIS — O0990 Supervision of high risk pregnancy, unspecified, unspecified trimester: Secondary | ICD-10-CM

## 2018-01-07 DIAGNOSIS — N883 Incompetence of cervix uteri: Secondary | ICD-10-CM

## 2018-01-07 DIAGNOSIS — O09292 Supervision of pregnancy with other poor reproductive or obstetric history, second trimester: Secondary | ICD-10-CM

## 2018-01-11 ENCOUNTER — Other Ambulatory Visit: Payer: Self-pay | Admitting: Obstetrics and Gynecology

## 2018-01-11 DIAGNOSIS — O26879 Cervical shortening, unspecified trimester: Secondary | ICD-10-CM

## 2018-01-11 DIAGNOSIS — O99019 Anemia complicating pregnancy, unspecified trimester: Secondary | ICD-10-CM

## 2018-01-11 DIAGNOSIS — O09292 Supervision of pregnancy with other poor reproductive or obstetric history, second trimester: Secondary | ICD-10-CM

## 2018-01-11 DIAGNOSIS — O09299 Supervision of pregnancy with other poor reproductive or obstetric history, unspecified trimester: Secondary | ICD-10-CM

## 2018-01-11 DIAGNOSIS — N883 Incompetence of cervix uteri: Secondary | ICD-10-CM

## 2018-01-11 DIAGNOSIS — O0990 Supervision of high risk pregnancy, unspecified, unspecified trimester: Secondary | ICD-10-CM

## 2018-01-11 DIAGNOSIS — O3432 Maternal care for cervical incompetence, second trimester: Secondary | ICD-10-CM

## 2018-01-11 DIAGNOSIS — O365990 Maternal care for other known or suspected poor fetal growth, unspecified trimester, not applicable or unspecified: Secondary | ICD-10-CM

## 2018-01-14 ENCOUNTER — Other Ambulatory Visit: Payer: Self-pay | Admitting: *Deleted

## 2018-01-14 ENCOUNTER — Other Ambulatory Visit: Payer: Medicare Other

## 2018-01-14 DIAGNOSIS — O365990 Maternal care for other known or suspected poor fetal growth, unspecified trimester, not applicable or unspecified: Secondary | ICD-10-CM

## 2018-01-15 ENCOUNTER — Encounter: Payer: Self-pay | Admitting: Maternal Newborn

## 2018-01-15 ENCOUNTER — Encounter: Payer: Medicare Other | Admitting: Obstetrics & Gynecology

## 2018-01-15 ENCOUNTER — Ambulatory Visit (INDEPENDENT_AMBULATORY_CARE_PROVIDER_SITE_OTHER): Payer: Medicare Other | Admitting: Maternal Newborn

## 2018-01-15 VITALS — BP 110/60 | Wt 162.0 lb

## 2018-01-15 DIAGNOSIS — O0990 Supervision of high risk pregnancy, unspecified, unspecified trimester: Secondary | ICD-10-CM

## 2018-01-15 DIAGNOSIS — Z3A25 25 weeks gestation of pregnancy: Secondary | ICD-10-CM

## 2018-01-15 DIAGNOSIS — O0992 Supervision of high risk pregnancy, unspecified, second trimester: Secondary | ICD-10-CM

## 2018-01-15 NOTE — Progress Notes (Signed)
Routine Prenatal Care Visit  Subjective  Alexis Mercado is a 32 y.o. G4P1011 at 9365w5d being seen today for prenatal care.  She is currently monitored for the following issues for this high-risk pregnancy and has History of pregnancy loss in prior pregnancy, currently pregnant in second trimester; Prior pregnancy complicated by SGA (small for gestational age), antepartum; Short cervix affecting pregnancy; Premature cervical dilation in second trimester; Cervical incompetence; UTI due to Klebsiella species; Anemia affecting fourth pregnancy; Supervision of high risk pregnancy, antepartum; and Pregnancy affected by fetal growth restriction on their problem list.  ----------------------------------------------------------------------------------- Patient reports cold symptoms and muscle ache in right leg.   Contractions: Not present. Vag. Bleeding: None.  Movement: Present. Denies leaking of fluid. Has cerclage in place. ----------------------------------------------------------------------------------- The following portions of the patient's history were reviewed and updated as appropriate: allergies, current medications, past family history, past medical history, past social history, past surgical history and problem list. Problem list updated.   Objective  Last menstrual period 07/19/2017. Pregravid weight 146 lb (66.2 kg) Total Weight Gain 16 lb (7.258 kg) Urinalysis:      Fetal Status: Fetal Heart Rate (bpm): 158 Fundal Height: 24 cm Movement: Present     General:  Alert, oriented and cooperative. Patient is in no acute distress.  Skin: Skin is warm and dry. No rash noted.   Cardiovascular: Normal heart rate noted  Respiratory: Normal respiratory effort, no problems with respiration noted  Abdomen: Soft, gravid, appropriate for gestational age. Pain/Pressure: Absent     Pelvic:  Cervical exam deferred        Extremities: Normal range of motion.  Edema: None  Mental Status: Normal  mood and affect. Normal behavior. Normal judgment and thought content.     Assessment   31 y.o. G4P1011 at 1265w5d, EDD  04/25/2018 by Last Menstrual Period presenting for routine prenatal visit.  Plan   pregnancy #4 Problems (from 10/16/17 to present)    Problem Noted Resolved   Pregnancy affected by fetal growth restriction 12/31/2017 by Grotegut, [place]had, MD No   Overview Signed 12/31/2017  9:31 AM by Grotegut, Italyhad, MD    Severe, symmetric FGR diagnosed at 23 weeks. [facility] Normal cell-free fetal DNA testing.      Supervision of high risk pregnancy, antepartum 11/16/2017 by Conard NovakJackson, Stephen D, MD No   Overview Addendum 12/04/2017 12:03 PM by Natale MilchSchuman, Christanna R, MD    Clinic [facility] Prenatal Labs  Dating LMP =12wk US Blood type: AB/Positive/-- (10/26 1439)   Genetic Screen NIPS: diploid XX Antibody:Negative (10/26 1439)  Anatomic US  normal Rubella: 2.01 (10/26 1439) Varicella: @VZVIGG @  GTT Third trimester:  RPR: Non Reactive (10/26 1439)   Rhogam n/a HBsAg: Negative (10/26 1439)   TDaP vaccine                       Flu Shot: HIV:   neg  Baby Food                                GBS:   Contraception  Pap: ASCUS HPV NEG 11/16/17  CBB     CS/VBAC    Support Person            UTI due to Klebsiella species 10/19/2017 by Oswaldo ConroySchmid, Nakhia Levitan Y, CNM No   Overview Signed 11/16/2017 12:10 PM by Conard NovakJackson, Stephen D, MD    [ ]  TOC  11/26 - collected. UCx sent      Anemia affecting fourth pregnancy 10/19/2017 by Oswaldo Conroy, CNM No   Cervical incompetence 02/04/2016 by Schermerhorn, Ihor Austin, MD No   Overview Signed 11/16/2017 12:09 PM by Conard Novak, MD    Appt with [facility] for management recommendations, cerclage placement, if needed.  Patient has no-showed for a few appointments.  Next is 11/29.  States she will keep.  She has anatomy ultrasound that appt, as well.       History of pregnancy loss in prior pregnancy, currently pregnant in  second trimester 01/31/2016 by Kirby Funk, MD No   Overview Addendum 11/16/2017 12:10 PM by Conard Novak, MD    Hx of 19 week PPROM/delivery in 2015 [ ]  [facility] consult now that has happened twice         Safe OTC medication list given; discussed OTC treatments for cold symptoms and muscle pain.  Preterm labor symptoms and general obstetric precautions including but not limited to vaginal bleeding, contractions, leaking of fluid and fetal movement were reviewed in detail with the patient.  Patient is transferring care to [facility]. Dorena Dew was present at this visit. We discussed all of the details with patient, including transportation for visits and delivery. She is aware that going forward, she will receive all of her care, including routine prenatal visits and labor and delivery care at [facility] in [place], except for antenatal testing done at the [place] office as determined by her providers at [facility]. She expressed understanding that she needs to ask at her next [facility] appointment about her 28 week labs and GTT which need to be done in two weeks and that her visit will be longer at that time because of the GTT.  Next follow up visit at [facility] on Monday, 01/17/18.  Marcelyn Bruins, CNM 01/15/2018  11:18 AM

## 2018-01-15 NOTE — Progress Notes (Signed)
No concerns.rj 

## 2018-01-18 ENCOUNTER — Ambulatory Visit
Admission: RE | Admit: 2018-01-18 | Discharge: 2018-01-18 | Disposition: A | Discharge status: Home / Self Care | Payer: Medicare Other | Source: Ambulatory Visit | Attending: Obstetrics and Gynecology | Admitting: Obstetrics and Gynecology

## 2018-01-18 DIAGNOSIS — O09292 Supervision of pregnancy with other poor reproductive or obstetric history, second trimester: Secondary | ICD-10-CM | POA: Diagnosis not present

## 2018-01-18 DIAGNOSIS — O365920 Maternal care for other known or suspected poor fetal growth, second trimester, not applicable or unspecified: Secondary | ICD-10-CM | POA: Diagnosis not present

## 2018-01-18 DIAGNOSIS — N883 Incompetence of cervix uteri: Secondary | ICD-10-CM

## 2018-01-18 DIAGNOSIS — B9689 Other specified bacterial agents as the cause of diseases classified elsewhere: Secondary | ICD-10-CM

## 2018-01-18 DIAGNOSIS — O0990 Supervision of high risk pregnancy, unspecified, unspecified trimester: Secondary | ICD-10-CM

## 2018-01-18 DIAGNOSIS — O365990 Maternal care for other known or suspected poor fetal growth, unspecified trimester, not applicable or unspecified: Secondary | ICD-10-CM

## 2018-01-18 DIAGNOSIS — N390 Urinary tract infection, site not specified: Secondary | ICD-10-CM

## 2018-01-18 DIAGNOSIS — Z3A26 26 weeks gestation of pregnancy: Secondary | ICD-10-CM | POA: Insufficient documentation

## 2018-01-18 DIAGNOSIS — O99019 Anemia complicating pregnancy, unspecified trimester: Secondary | ICD-10-CM

## 2018-01-18 DIAGNOSIS — B961 Klebsiella pneumoniae [K. pneumoniae] as the cause of diseases classified elsewhere: Secondary | ICD-10-CM

## 2018-01-21 ENCOUNTER — Other Ambulatory Visit: Payer: Self-pay | Admitting: *Deleted

## 2018-01-21 ENCOUNTER — Telehealth: Payer: Self-pay | Admitting: *Deleted

## 2018-01-21 ENCOUNTER — Inpatient Hospital Stay: Admission: RE | Admit: 2018-01-21 | Payer: Medicare Other | Source: Ambulatory Visit

## 2018-01-21 DIAGNOSIS — O365990 Maternal care for other known or suspected poor fetal growth, unspecified trimester, not applicable or unspecified: Secondary | ICD-10-CM

## 2018-01-21 NOTE — Telephone Encounter (Signed)
Called Dorena DewMarilyn Steele (pregnancy care manager) and explained that Hurshel PartyShaquanna did not keep her [facility].  I tried to call the patient but her phone number has been disconnected.  She has an appointment here in  at the Perinatal clinic on Monday 01/25/18 at 11am.  Jola BabinskiMarilyn is going to attempt to get in touch with Korayma to ensure she makes it to this appointment on Monday.

## 2018-01-24 ENCOUNTER — Inpatient Hospital Stay: Payer: Medicare Other

## 2018-01-24 ENCOUNTER — Other Ambulatory Visit: Payer: Self-pay

## 2018-01-24 ENCOUNTER — Inpatient Hospital Stay
Admission: EM | Admit: 2018-01-24 | Discharge: 2018-01-25 | DRG: 831 | Disposition: A | Discharge status: Other Acute Inpatient Hospital | Payer: Medicare Other | Source: Ambulatory Visit | Attending: Obstetrics & Gynecology | Admitting: Obstetrics & Gynecology

## 2018-01-24 DIAGNOSIS — Z3A27 27 weeks gestation of pregnancy: Secondary | ICD-10-CM | POA: Diagnosis not present

## 2018-01-24 DIAGNOSIS — J189 Pneumonia, unspecified organism: Secondary | ICD-10-CM | POA: Diagnosis not present

## 2018-01-24 DIAGNOSIS — R059 Cough, unspecified: Secondary | ICD-10-CM | POA: Diagnosis present

## 2018-01-24 DIAGNOSIS — O99512 Diseases of the respiratory system complicating pregnancy, second trimester: Principal | ICD-10-CM | POA: Diagnosis present

## 2018-01-24 DIAGNOSIS — J111 Influenza due to unidentified influenza virus with other respiratory manifestations: Secondary | ICD-10-CM | POA: Diagnosis present

## 2018-01-24 DIAGNOSIS — O365920 Maternal care for other known or suspected poor fetal growth, second trimester, not applicable or unspecified: Secondary | ICD-10-CM | POA: Diagnosis present

## 2018-01-24 DIAGNOSIS — Z3A28 28 weeks gestation of pregnancy: Secondary | ICD-10-CM | POA: Diagnosis not present

## 2018-01-24 DIAGNOSIS — R05 Cough: Secondary | ICD-10-CM | POA: Diagnosis present

## 2018-01-24 DIAGNOSIS — O365990 Maternal care for other known or suspected poor fetal growth, unspecified trimester, not applicable or unspecified: Secondary | ICD-10-CM

## 2018-01-24 LAB — URINALYSIS, COMPLETE (UACMP) WITH MICROSCOPIC
BACTERIA UA: NONE SEEN
Bilirubin Urine: NEGATIVE
Glucose, UA: NEGATIVE mg/dL
Hgb urine dipstick: NEGATIVE
Ketones, ur: 20 mg/dL — AB
Leukocytes, UA: NEGATIVE
NITRITE: NEGATIVE
PROTEIN: NEGATIVE mg/dL
Specific Gravity, Urine: 1.025 (ref 1.005–1.030)
pH: 5 (ref 5.0–8.0)

## 2018-01-24 LAB — INFLUENZA PANEL BY PCR (TYPE A & B)
INFLAPCR: POSITIVE — AB
INFLBPCR: NEGATIVE

## 2018-01-24 LAB — CBC WITH DIFFERENTIAL/PLATELET
BASOS PCT: 0 %
Basophils Absolute: 0 10*3/uL (ref 0–0.1)
EOS ABS: 0 10*3/uL (ref 0–0.7)
Eosinophils Relative: 0 %
HEMATOCRIT: 29.9 % — AB (ref 35.0–47.0)
HEMOGLOBIN: 9.7 g/dL — AB (ref 12.0–16.0)
Lymphocytes Relative: 3 %
Lymphs Abs: 0.4 10*3/uL — ABNORMAL LOW (ref 1.0–3.6)
MCH: 20.9 pg — ABNORMAL LOW (ref 26.0–34.0)
MCHC: 32.4 g/dL (ref 32.0–36.0)
MCV: 64.6 fL — ABNORMAL LOW (ref 80.0–100.0)
MONO ABS: 1.8 10*3/uL — AB (ref 0.2–0.9)
MONOS PCT: 17 %
NEUTROS PCT: 80 %
Neutro Abs: 8.8 10*3/uL — ABNORMAL HIGH (ref 1.4–6.5)
Platelets: 193 10*3/uL (ref 150–440)
RBC: 4.63 MIL/uL (ref 3.80–5.20)
RDW: 16.4 % — AB (ref 11.5–14.5)
WBC: 11 10*3/uL (ref 3.6–11.0)

## 2018-01-24 LAB — CHLAMYDIA/NGC RT PCR (ARMC ONLY)
CHLAMYDIA TR: NOT DETECTED
N gonorrhoeae: NOT DETECTED

## 2018-01-24 LAB — ROM PLUS (ARMC ONLY): ROM PLUS: NEGATIVE

## 2018-01-24 MED ORDER — PRENATAL MULTIVITAMIN CH: 1.0000 | ORAL_TABLET | Freq: Every day | ORAL | Status: DC

## 2018-01-24 MED ORDER — LACTATED RINGERS IV BOLUS (SEPSIS)
500.0000 mL | Freq: Once | INTRAVENOUS | Status: AC
  Administered 2018-01-24: 500 mL via INTRAVENOUS

## 2018-01-24 MED ORDER — OSELTAMIVIR PHOSPHATE 75 MG PO CAPS
75.0000 mg | ORAL_CAPSULE | Freq: Two times a day (BID) | ORAL | Status: DC
  Administered 2018-01-24: 75 mg via ORAL
  Filled 2018-01-24 (×2): qty 1

## 2018-01-24 MED ORDER — DEXTROSE 5 % IV SOLN
500.0000 mg | INTRAVENOUS | Status: DC
  Administered 2018-01-24: 500 mg via INTRAVENOUS
  Filled 2018-01-24 (×2): qty 500

## 2018-01-24 MED ORDER — ACETAMINOPHEN 325 MG PO TABS
ORAL_TABLET | ORAL | Status: AC
  Filled 2018-01-24: qty 2

## 2018-01-24 MED ORDER — LACTATED RINGERS IV SOLN: INTRAVENOUS | Status: DC

## 2018-01-24 MED ORDER — ACETAMINOPHEN 325 MG PO TABS
650.0000 mg | ORAL_TABLET | Freq: Four times a day (QID) | ORAL | Status: DC | PRN
  Administered 2018-01-24 – 2018-01-25 (×2): 650 mg via ORAL
  Filled 2018-01-24: qty 2

## 2018-01-24 MED ORDER — ONDANSETRON HCL 4 MG/2ML IJ SOLN
4.0000 mg | Freq: Four times a day (QID) | INTRAMUSCULAR | Status: DC | PRN
  Administered 2018-01-24: 4 mg via INTRAVENOUS
  Filled 2018-01-24: qty 2

## 2018-01-24 MED ORDER — SODIUM CHLORIDE 0.9 % IV SOLN
INTRAVENOUS | Status: DC
  Administered 2018-01-24: 21:00:00 via INTRAVENOUS

## 2018-01-24 MED ORDER — DEXTROSE 5 % IV SOLN
1.0000 g | INTRAVENOUS | Status: DC
  Administered 2018-01-24: 1 g via INTRAVENOUS
  Filled 2018-01-24 (×2): qty 10

## 2018-01-24 NOTE — OB Triage Note (Signed)
Pt c/o leaking clear fluid since 9 am this morning and contractions. She has a history of IUFD.

## 2018-01-24 NOTE — H&P (Addendum)
History and Physical   Alexis Mercado is an 32 y.o. female.  HPI: patient presents to labor and delivery. She complains of illness, cough, fever, nausea, vomiting, and vaginal leakage of fluid. Most of these symptoms started this morning, but she says the cough has been present for 1 week. She reports that cough is her most bothersome symptom. She reports sputum production. She received her flu vaccination in October. She is feeling fetal movement. She denies uterine tenderness. She denies dysuria.  She has a cerclage in place.  Patient is poor historian.   Obstetrical History [id number] 2019 - current, cerclage  2017 - 20wk stillbirth 2015 - 19 wk spontaneous abortion 2007 - 37 wk vaginal delivery   Past Medical History:  Diagnosis Date  . 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 Depression   . History of abnormal cervical Pap smear   . Premature delivery 2014   septic abortion - chorio and UTI untreated at 19 6/7 weeks  . Preterm labor     Past Surgical History:  Procedure Laterality Date  . CERVICAL CERCLAGE    . COLPOSCOPY  04/2013   CIN 1    Family History  Problem Relation Age of Onset  . Asthma Daughter   . Diabetes Daughter   . Diabetes Father   . Cancer Neg Hx   . Stroke Neg Hx   . Thyroid disease Neg Hx   . Hypertension Neg Hx     Social History:  reports that  has never smoked. she has never used smokeless tobacco. She reports that she does not drink alcohol or use drugs.  Allergies: No Known Allergies  Medications: I have reviewed the patient's current medications.  Results for orders placed or performed during the hospital encounter of 01/24/18 (from the past 48 hour(s))  ROM Plus ([facility] only)     Status: None   Collection Time: 01/24/18  6:34 PM  Result Value Ref Range   Rom Plus NEGATIVE     Comment: Performed at [facility], [address] [place], [place] [address]  CBC with Differential/Platelet     Status: Abnormal   Collection Time:  01/24/18  6:36 PM  Result Value Ref Range   WBC 11.0 3.6 - 11.0 K/uL   RBC 4.63 3.80 - 5.20 MIL/uL   Hemoglobin 9.7 (L) 12.0 - 16.0 g/dL   HCT 81.1 (L) 91.4 - 78.2 %   MCV 64.6 (L) 80.0 - 100.0 fL   MCH 20.9 (L) 26.0 - 34.0 pg   MCHC 32.4 32.0 - 36.0 g/dL   RDW 95.6 (H) 21.3 - 08.6 %   Platelets 193 150 - 440 K/uL    Comment: PLATELET COUNT CONFIRMED BY SMEAR   Neutrophils Relative % 80 %   Neutro Abs 8.8 (H) 1.4 - 6.5 K/uL   Lymphocytes Relative 3 %   Lymphs Abs 0.4 (L) 1.0 - 3.6 K/uL   Monocytes Relative 17 %   Monocytes Absolute 1.8 (H) 0.2 - 0.9 K/uL   Eosinophils Relative 0 %   Eosinophils Absolute 0.0 0 - 0.7 K/uL   Basophils Relative 0 %   Basophils Absolute 0.0 0 - 0.1 K/uL    Comment: Performed at [facility], [address] [place], [place] [address]  Urinalysis, Complete w Microscopic     Status: Abnormal   Collection Time: 01/24/18  6:36 PM  Result Value Ref Range   Color, Urine YELLOW (A) YELLOW   APPearance CLEAR (A) CLEAR  Specific Gravity, Urine 1.025 1.005 - 1.030   pH 5.0 5.0 - 8.0   Glucose, UA NEGATIVE NEGATIVE mg/dL   Hgb urine dipstick NEGATIVE NEGATIVE   Bilirubin Urine NEGATIVE NEGATIVE   Ketones, ur 20 (A) NEGATIVE mg/dL   Protein, ur NEGATIVE NEGATIVE mg/dL   Nitrite NEGATIVE NEGATIVE   Leukocytes, UA NEGATIVE NEGATIVE   RBC / HPF 0-5 0 - 5 RBC/hpf   WBC, UA 0-5 0 - 5 WBC/hpf   Bacteria, UA NONE SEEN NONE SEEN   Squamous Epithelial / LPF 6-30 (A) NONE SEEN   Mucus PRESENT     Comment: Performed at [facility], [address] [place], [place] [address]    No results found.  ROS Blood pressure 100/61, pulse (!) 124, temperature 98.6 F (37 C), temperature source Oral, resp. rate 16, last menstrual period 07/19/2017, SpO2 100 %. Physical Exam  Nursing note and vitals reviewed. Constitutional: She is oriented to person, place, and time. She appears well-developed and well-nourished.  HENT:  Head: Normocephalic and  atraumatic.  Cardiovascular: Normal rate.  tachycardia  Respiratory: Effort normal. She has wheezes.  GI: Soft. Bowel sounds are normal.  Genitourinary:  Genitourinary Comments: Normal vaginal discharge. No pooling of fluid SVE: cervix closed, no tension on cerclage. No fundal tenderness with palpation.  Musculoskeletal: Normal range of motion.  Neurological: She is alert and oriented to person, place, and time.  Skin: Skin is warm. No rash noted. She is diaphoretic.  Psychiatric: She has a normal mood and affect. Her behavior is normal. Judgment and thought content normal.    Assessment/Plan: 31yo [id number] at  1. Maternal Illness, suspect community acquired pneumonia vs. Bronchitis vs. Flu  CBC, UA, blood cultures, chest x-ray, will start IV antibiotics for community acquired pneumonia, IV fluids and tylenol for fever. 2. Leakage of fluid- no pooling seen on exam, ROM plus negative. Wet prep and GC/CT sent. Rupture not suspected at this time 3. Cerclage in place, intact, no dilation. No tension on stitch. 4. Fetal and maternal tachycardia, likely related to fever and illness, tylenol and IV fluids, continuous fetal monitoring.   Alexis Mercado 01/24/2018, 8:34 PM

## 2018-01-25 ENCOUNTER — Inpatient Hospital Stay: Admission: RE | Admit: 2018-01-25 | Payer: Medicare Other | Source: Ambulatory Visit

## 2018-01-25 DIAGNOSIS — O99512 Diseases of the respiratory system complicating pregnancy, second trimester: Secondary | ICD-10-CM

## 2018-01-25 DIAGNOSIS — Z3A27 27 weeks gestation of pregnancy: Secondary | ICD-10-CM

## 2018-01-25 DIAGNOSIS — J111 Influenza due to unidentified influenza virus with other respiratory manifestations: Secondary | ICD-10-CM

## 2018-01-25 DIAGNOSIS — J189 Pneumonia, unspecified organism: Secondary | ICD-10-CM

## 2018-01-25 LAB — URINE DRUG SCREEN, QUALITATIVE (ARMC ONLY)
Amphetamines, Ur Screen: NOT DETECTED
BARBITURATES, UR SCREEN: NOT DETECTED
BENZODIAZEPINE, UR SCRN: NOT DETECTED
CANNABINOID 50 NG, UR ~~LOC~~: NOT DETECTED
Cocaine Metabolite,Ur ~~LOC~~: NOT DETECTED
MDMA (ECSTASY) UR SCREEN: NOT DETECTED
Methadone Scn, Ur: NOT DETECTED
Opiate, Ur Screen: NOT DETECTED
PHENCYCLIDINE (PCP) UR S: NOT DETECTED
TRICYCLIC, UR SCREEN: NOT DETECTED

## 2018-01-25 LAB — BASIC METABOLIC PANEL
Anion gap: 11 (ref 5–15)
BUN: 8 mg/dL (ref 6–20)
CALCIUM: 8.2 mg/dL — AB (ref 8.9–10.3)
CO2: 21 mmol/L — ABNORMAL LOW (ref 22–32)
CREATININE: 0.59 mg/dL (ref 0.44–1.00)
Chloride: 105 mmol/L (ref 101–111)
GFR calc Af Amer: >60 mL/min (ref >60)
GLUCOSE: 124 mg/dL — AB (ref 65–99)
POTASSIUM: 2.9 mmol/L — AB (ref 3.5–5.1)
SODIUM: 137 mmol/L (ref 135–145)

## 2018-01-25 LAB — CBC
HEMATOCRIT: 28.4 % — AB (ref 35.0–47.0)
HEMOGLOBIN: 9.1 g/dL — AB (ref 12.0–16.0)
MCH: 21 pg — ABNORMAL LOW (ref 26.0–34.0)
MCHC: 32 g/dL (ref 32.0–36.0)
MCV: 65.7 fL — ABNORMAL LOW (ref 80.0–100.0)
Platelets: 150 10*3/uL (ref 150–440)
RBC: 4.32 MIL/uL (ref 3.80–5.20)
RDW: 16.1 % — ABNORMAL HIGH (ref 11.5–14.5)
WBC: 8.3 10*3/uL (ref 3.6–11.0)

## 2018-01-25 LAB — EXPECTORATED SPUTUM ASSESSMENT W GRAM STAIN, RFLX TO RESP C

## 2018-01-25 LAB — EXPECTORATED SPUTUM ASSESSMENT W REFEX TO RESP CULTURE

## 2018-01-25 LAB — LACTIC ACID, PLASMA: Lactic Acid, Venous: 0.8 mmol/L (ref 0.5–1.9)

## 2018-01-25 MED ORDER — BETAMETHASONE SOD PHOS & ACET 6 (3-3) MG/ML IJ SUSP
12.0000 mg | Freq: Once | INTRAMUSCULAR | Status: AC
  Administered 2018-01-25: 12 mg via INTRAMUSCULAR

## 2018-01-25 MED ORDER — DEXTROSE IN LACTATED RINGERS 5 % IV SOLN
INTRAVENOUS | Status: DC
  Administered 2018-01-25: 05:00:00 via INTRAVENOUS

## 2018-01-25 NOTE — Progress Notes (Signed)
[facility], called Revonda StandardAllison for bed assignment, [facility].

## 2018-01-25 NOTE — Clinical Social Work Note (Signed)
CSW called Labor and Delivery nurse to ask some questions regarding the CSW referral. Tresa EndoKelly informed CSW that [facility] and consult will not need to be completed. York SpanielMonica Doretha Goding MSW,LCSW [phone]

## 2018-01-25 NOTE — Progress Notes (Signed)
Patient ID: Alexis Mercado, female   DOB: 1986/08/22, 32 y.o.   MRN: [id number] Called to evaluate fetal heart tones.  Baseline 150s, minimal variability for the last 45 minutes, will have patient change to left lateral position. Patient was initially 100% on RA, but since she has been asleep she is 96% on RA. Will start nasal cannula.  Update: With position change patient has a spontaneous deceleration to the 60s which lasted for about 5 minutes. Patient has had at least two other spontaneous decelerations since she has been on the monitor.  Patient's pregnancy has been complicated by severe IUGR less than 1%.  Will give betamethasone. Will obtain US for growth, BPP, and UA dopplers. Adelene Idlerhristanna Yuta Cipollone MD 01/25/18 5:05 AM

## 2018-01-25 NOTE — Discharge Summary (Signed)
OB Triage Discharge Summary   32 y.o. [phone] @ 3540w1d presenting for flu-like illness with fever, nausea and vomiting, productive cough. Felt that her membranes had ruptured, but exam on arrival showed cerclage in place, no pooling, negative nitrazine. There has been no fluid loss in triage. Laboratory testing showed positive influenza  A, receiving Tamiflu. Chest x-ray showed patchy opacity/mild pneumonia. IV Rocephin given.  Physical exam: Constitutional: NAD, AxO times 3 HEENT: Normocephalic, atraumatic Cardiovascular: RRR, S1, S2 auscultated, no murmur, rub or gallop; tachycardia Respiratory: No increased work of breathing on 2L O2, saturation 99%, bilateral wheezing GI: Abdomen soft, gravid, non-tender GU: Cervical exam deferred, no signs of preterm labor MSK: MAEW, steady gait Skin: Diaphoretic, warm and intact Psychiatric: Normal mood and affect  Fetal status:  EFM is currently reassuring, baseline 145 with moderate variability and 10x10 accelerations present, toco quiet. However, a prolonged deceleration to 60s occurred early this morning that lasted for around 5 minutes. There have been at least two other spontaneous decelerations during monitoring this visit. One dose of betamethasone was given this morning at 0504.  Labs pending:  Blood cultures and sputum cultures pending  Disposition:  [facility] on recommendation of Dr. Quin HoopEllestad [facility]. Dr. Dolphus JennySmall accepting transfer of care. Patient has been apprised of the benefits and risks of transfer and voices understanding and consent.  Marcelyn BruinsJacelyn Layana Konkel, CNM 01/25/2018  11:00 AM

## 2018-01-25 NOTE — Progress Notes (Signed)
[facility]cher to [facility] for continued care. VS Stable, consent to transfer signed by pt.

## 2018-01-28 ENCOUNTER — Other Ambulatory Visit: Payer: Self-pay | Admitting: *Deleted

## 2018-01-28 ENCOUNTER — Other Ambulatory Visit: Payer: Self-pay | Admitting: Maternal & Fetal Medicine

## 2018-01-28 ENCOUNTER — Ambulatory Visit
Admission: RE | Admit: 2018-01-28 | Discharge: 2018-01-28 | Disposition: A | Discharge status: Home / Self Care | Payer: Medicare Other | Source: Ambulatory Visit | Attending: Obstetrics & Gynecology | Admitting: Obstetrics & Gynecology

## 2018-01-28 DIAGNOSIS — O365920 Maternal care for other known or suspected poor fetal growth, second trimester, not applicable or unspecified: Secondary | ICD-10-CM | POA: Diagnosis present

## 2018-01-28 DIAGNOSIS — N883 Incompetence of cervix uteri: Secondary | ICD-10-CM

## 2018-01-28 DIAGNOSIS — Z3A27 27 weeks gestation of pregnancy: Secondary | ICD-10-CM | POA: Diagnosis not present

## 2018-01-28 DIAGNOSIS — B961 Klebsiella pneumoniae [K. pneumoniae] as the cause of diseases classified elsewhere: Secondary | ICD-10-CM

## 2018-01-28 DIAGNOSIS — O365990 Maternal care for other known or suspected poor fetal growth, unspecified trimester, not applicable or unspecified: Secondary | ICD-10-CM

## 2018-01-28 DIAGNOSIS — B9689 Other specified bacterial agents as the cause of diseases classified elsewhere: Secondary | ICD-10-CM

## 2018-01-28 DIAGNOSIS — O0990 Supervision of high risk pregnancy, unspecified, unspecified trimester: Secondary | ICD-10-CM

## 2018-01-28 DIAGNOSIS — O09292 Supervision of pregnancy with other poor reproductive or obstetric history, second trimester: Secondary | ICD-10-CM

## 2018-01-28 DIAGNOSIS — N390 Urinary tract infection, site not specified: Secondary | ICD-10-CM

## 2018-01-28 DIAGNOSIS — O99019 Anemia complicating pregnancy, unspecified trimester: Secondary | ICD-10-CM

## 2018-01-28 NOTE — Progress Notes (Signed)
Scheduled NST per Dr. Tyler DeisWheeler request in L&D tomorrow at 11am.  Patient aware of date and time of appointment.  Dorena DewMarilyn Steele (pregancy care manager) aware of plan.

## 2018-01-29 ENCOUNTER — Telehealth: Payer: Self-pay | Admitting: Obstetrics and Gynecology

## 2018-01-29 LAB — CULTURE, BLOOD (ROUTINE X 2)
Culture: NO GROWTH
Culture: NO GROWTH
SPECIAL REQUESTS: ADEQUATE
SPECIAL REQUESTS: ADEQUATE

## 2018-01-29 NOTE — Telephone Encounter (Signed)
Left generic VM to call us back. I will not be in the office next week.  So, I have left the paperwork with Meriam SpragueBeverly for whoever returns her call or attempts to call her back. Intention of the call was to follow up to discuss her care and the fact that she is now refusing to show up for recommended appointments.  She has no shown up to [facility].  She has a severe pregnancy condition with early-onset severe fetal growth restriction (<1%ile). She [facility] yesterday (6/10 breathing, non-reactive NST).  She was recommended to have a follow up NST on L&D on 2/8 and the patient refused to go, per a note I was given by Dorena DewMarilyn Steele (social worker).  Jola BabinskiMarilyn requested that a [facility] provider call the patient and explain to her the severity of her situation and the need for careful follow up and following the plan to attempt to get the best out come for her pregnancy.

## 2018-02-01 ENCOUNTER — Other Ambulatory Visit: Payer: Self-pay | Admitting: *Deleted

## 2018-02-01 ENCOUNTER — Ambulatory Visit
Admission: RE | Admit: 2018-02-01 | Discharge: 2018-02-01 | Disposition: A | Discharge status: Home / Self Care | Payer: Medicare Other | Source: Ambulatory Visit | Attending: Maternal & Fetal Medicine | Admitting: Maternal & Fetal Medicine

## 2018-02-01 DIAGNOSIS — O09292 Supervision of pregnancy with other poor reproductive or obstetric history, second trimester: Secondary | ICD-10-CM

## 2018-02-01 DIAGNOSIS — O365990 Maternal care for other known or suspected poor fetal growth, unspecified trimester, not applicable or unspecified: Secondary | ICD-10-CM

## 2018-02-01 DIAGNOSIS — B9689 Other specified bacterial agents as the cause of diseases classified elsewhere: Secondary | ICD-10-CM

## 2018-02-01 DIAGNOSIS — O26879 Cervical shortening, unspecified trimester: Secondary | ICD-10-CM

## 2018-02-01 DIAGNOSIS — N390 Urinary tract infection, site not specified: Secondary | ICD-10-CM

## 2018-02-01 DIAGNOSIS — N883 Incompetence of cervix uteri: Secondary | ICD-10-CM

## 2018-02-01 DIAGNOSIS — O365930 Maternal care for other known or suspected poor fetal growth, third trimester, not applicable or unspecified: Secondary | ICD-10-CM | POA: Diagnosis present

## 2018-02-01 DIAGNOSIS — O3432 Maternal care for cervical incompetence, second trimester: Secondary | ICD-10-CM

## 2018-02-01 DIAGNOSIS — Z3A28 28 weeks gestation of pregnancy: Secondary | ICD-10-CM | POA: Insufficient documentation

## 2018-02-01 DIAGNOSIS — O0990 Supervision of high risk pregnancy, unspecified, unspecified trimester: Secondary | ICD-10-CM

## 2018-02-01 DIAGNOSIS — O99019 Anemia complicating pregnancy, unspecified trimester: Secondary | ICD-10-CM

## 2018-02-01 DIAGNOSIS — B961 Klebsiella pneumoniae [K. pneumoniae] as the cause of diseases classified elsewhere: Secondary | ICD-10-CM

## 2018-02-04 ENCOUNTER — Other Ambulatory Visit: Payer: Self-pay | Admitting: *Deleted

## 2018-02-04 ENCOUNTER — Inpatient Hospital Stay: Admission: RE | Admit: 2018-02-04 | Payer: Medicare Other | Source: Ambulatory Visit

## 2018-02-04 DIAGNOSIS — O09299 Supervision of pregnancy with other poor reproductive or obstetric history, unspecified trimester: Secondary | ICD-10-CM

## 2018-02-08 ENCOUNTER — Other Ambulatory Visit: Payer: Medicare Other

## 2018-02-08 ENCOUNTER — Other Ambulatory Visit: Payer: Self-pay | Admitting: *Deleted

## 2018-02-08 DIAGNOSIS — O09299 Supervision of pregnancy with other poor reproductive or obstetric history, unspecified trimester: Secondary | ICD-10-CM

## 2018-02-08 NOTE — Telephone Encounter (Signed)
Pt has not called back. Looks like [facility] Can you see if pt went to appt?

## 2018-02-08 NOTE — Telephone Encounter (Signed)
Pt did go to transfer of care appointment in [place] last week and being seen for weekly testing in [place] due to transportation and compliance for patient. We are no longer seeing this patient and confirmed with Dorena DewMarilyn Steele that transfer has been completed.

## 2018-02-11 ENCOUNTER — Other Ambulatory Visit: Payer: Self-pay | Admitting: *Deleted

## 2018-02-11 ENCOUNTER — Other Ambulatory Visit: Payer: Medicare Other

## 2018-02-11 DIAGNOSIS — O09292 Supervision of pregnancy with other poor reproductive or obstetric history, second trimester: Secondary | ICD-10-CM

## 2018-02-15 ENCOUNTER — Ambulatory Visit
Admission: RE | Admit: 2018-02-15 | Discharge: 2018-02-15 | Disposition: A | Discharge status: Home / Self Care | Payer: Medicare Other | Source: Ambulatory Visit | Attending: Maternal & Fetal Medicine | Admitting: Maternal & Fetal Medicine

## 2018-02-15 DIAGNOSIS — O09299 Supervision of pregnancy with other poor reproductive or obstetric history, unspecified trimester: Secondary | ICD-10-CM

## 2018-02-15 DIAGNOSIS — Z3A30 30 weeks gestation of pregnancy: Secondary | ICD-10-CM | POA: Insufficient documentation

## 2018-02-15 DIAGNOSIS — N390 Urinary tract infection, site not specified: Secondary | ICD-10-CM

## 2018-02-15 DIAGNOSIS — O99019 Anemia complicating pregnancy, unspecified trimester: Secondary | ICD-10-CM

## 2018-02-15 DIAGNOSIS — O365990 Maternal care for other known or suspected poor fetal growth, unspecified trimester, not applicable or unspecified: Secondary | ICD-10-CM

## 2018-02-15 DIAGNOSIS — O09292 Supervision of pregnancy with other poor reproductive or obstetric history, second trimester: Secondary | ICD-10-CM | POA: Insufficient documentation

## 2018-02-15 DIAGNOSIS — B961 Klebsiella pneumoniae [K. pneumoniae] as the cause of diseases classified elsewhere: Secondary | ICD-10-CM

## 2018-02-15 DIAGNOSIS — N883 Incompetence of cervix uteri: Secondary | ICD-10-CM

## 2018-02-15 DIAGNOSIS — O0990 Supervision of high risk pregnancy, unspecified, unspecified trimester: Secondary | ICD-10-CM

## 2018-02-18 ENCOUNTER — Other Ambulatory Visit: Payer: Medicare Other

## 2018-02-22 ENCOUNTER — Other Ambulatory Visit: Payer: Medicare Other

## 2018-02-25 ENCOUNTER — Other Ambulatory Visit: Payer: Self-pay | Admitting: *Deleted

## 2018-02-25 DIAGNOSIS — N883 Incompetence of cervix uteri: Secondary | ICD-10-CM

## 2018-02-25 DIAGNOSIS — O09299 Supervision of pregnancy with other poor reproductive or obstetric history, unspecified trimester: Secondary | ICD-10-CM

## 2018-03-01 ENCOUNTER — Inpatient Hospital Stay: Admission: RE | Admit: 2018-03-01 | Payer: Medicare Other | Source: Ambulatory Visit

## 2018-03-04 ENCOUNTER — Other Ambulatory Visit: Payer: Self-pay | Admitting: *Deleted

## 2018-03-04 DIAGNOSIS — O365990 Maternal care for other known or suspected poor fetal growth, unspecified trimester, not applicable or unspecified: Secondary | ICD-10-CM

## 2018-03-08 ENCOUNTER — Ambulatory Visit
Admission: RE | Admit: 2018-03-08 | Discharge: 2018-03-08 | Disposition: A | Discharge status: Home / Self Care | Payer: Medicare Other | Source: Ambulatory Visit | Attending: Obstetrics & Gynecology | Admitting: Obstetrics & Gynecology

## 2018-03-08 DIAGNOSIS — B961 Klebsiella pneumoniae [K. pneumoniae] as the cause of diseases classified elsewhere: Secondary | ICD-10-CM

## 2018-03-08 DIAGNOSIS — O365930 Maternal care for other known or suspected poor fetal growth, third trimester, not applicable or unspecified: Secondary | ICD-10-CM | POA: Diagnosis not present

## 2018-03-08 DIAGNOSIS — O0990 Supervision of high risk pregnancy, unspecified, unspecified trimester: Secondary | ICD-10-CM

## 2018-03-08 DIAGNOSIS — O09292 Supervision of pregnancy with other poor reproductive or obstetric history, second trimester: Secondary | ICD-10-CM

## 2018-03-08 DIAGNOSIS — IMO0002 Reserved for concepts with insufficient information to code with codable children: Secondary | ICD-10-CM

## 2018-03-08 DIAGNOSIS — O3432 Maternal care for cervical incompetence, second trimester: Secondary | ICD-10-CM

## 2018-03-08 DIAGNOSIS — O365990 Maternal care for other known or suspected poor fetal growth, unspecified trimester, not applicable or unspecified: Secondary | ICD-10-CM

## 2018-03-08 DIAGNOSIS — N390 Urinary tract infection, site not specified: Secondary | ICD-10-CM

## 2018-03-08 DIAGNOSIS — O99019 Anemia complicating pregnancy, unspecified trimester: Secondary | ICD-10-CM

## 2018-03-08 DIAGNOSIS — O26879 Cervical shortening, unspecified trimester: Secondary | ICD-10-CM

## 2018-03-08 DIAGNOSIS — Z3A33 33 weeks gestation of pregnancy: Secondary | ICD-10-CM | POA: Diagnosis present

## 2018-03-08 DIAGNOSIS — B9689 Other specified bacterial agents as the cause of diseases classified elsewhere: Secondary | ICD-10-CM

## 2018-03-08 DIAGNOSIS — N883 Incompetence of cervix uteri: Secondary | ICD-10-CM

## 2018-03-15 ENCOUNTER — Ambulatory Visit
Admission: RE | Admit: 2018-03-15 | Discharge: 2018-03-15 | Disposition: A | Discharge status: Home / Self Care | Payer: Medicare Other | Source: Ambulatory Visit | Attending: Maternal & Fetal Medicine | Admitting: Maternal & Fetal Medicine

## 2018-03-15 DIAGNOSIS — N883 Incompetence of cervix uteri: Secondary | ICD-10-CM | POA: Diagnosis present

## 2018-03-15 DIAGNOSIS — O99019 Anemia complicating pregnancy, unspecified trimester: Secondary | ICD-10-CM

## 2018-03-15 DIAGNOSIS — B961 Klebsiella pneumoniae [K. pneumoniae] as the cause of diseases classified elsewhere: Secondary | ICD-10-CM

## 2018-03-15 DIAGNOSIS — O09293 Supervision of pregnancy with other poor reproductive or obstetric history, third trimester: Secondary | ICD-10-CM | POA: Insufficient documentation

## 2018-03-15 DIAGNOSIS — O09292 Supervision of pregnancy with other poor reproductive or obstetric history, second trimester: Secondary | ICD-10-CM

## 2018-03-15 DIAGNOSIS — Z3A34 34 weeks gestation of pregnancy: Secondary | ICD-10-CM | POA: Diagnosis not present

## 2018-03-15 DIAGNOSIS — O365990 Maternal care for other known or suspected poor fetal growth, unspecified trimester, not applicable or unspecified: Secondary | ICD-10-CM

## 2018-03-15 DIAGNOSIS — N390 Urinary tract infection, site not specified: Secondary | ICD-10-CM

## 2018-03-15 DIAGNOSIS — O3433 Maternal care for cervical incompetence, third trimester: Secondary | ICD-10-CM | POA: Insufficient documentation

## 2018-03-15 DIAGNOSIS — O0990 Supervision of high risk pregnancy, unspecified, unspecified trimester: Secondary | ICD-10-CM

## 2018-03-15 DIAGNOSIS — O09299 Supervision of pregnancy with other poor reproductive or obstetric history, unspecified trimester: Secondary | ICD-10-CM

## 2018-03-18 ENCOUNTER — Other Ambulatory Visit: Payer: Self-pay | Admitting: *Deleted

## 2018-03-18 DIAGNOSIS — O365990 Maternal care for other known or suspected poor fetal growth, unspecified trimester, not applicable or unspecified: Secondary | ICD-10-CM

## 2018-03-22 ENCOUNTER — Other Ambulatory Visit: Payer: Medicare Other

## 2018-03-29 ENCOUNTER — Ambulatory Visit
Admission: RE | Admit: 2018-03-29 | Discharge: 2018-03-29 | Disposition: A | Discharge status: Home / Self Care | Payer: Medicare Other | Source: Ambulatory Visit | Attending: Maternal & Fetal Medicine | Admitting: Maternal & Fetal Medicine

## 2018-03-29 DIAGNOSIS — O09292 Supervision of pregnancy with other poor reproductive or obstetric history, second trimester: Secondary | ICD-10-CM

## 2018-03-29 DIAGNOSIS — O99019 Anemia complicating pregnancy, unspecified trimester: Secondary | ICD-10-CM

## 2018-03-29 DIAGNOSIS — Z3A36 36 weeks gestation of pregnancy: Secondary | ICD-10-CM | POA: Insufficient documentation

## 2018-03-29 DIAGNOSIS — O09299 Supervision of pregnancy with other poor reproductive or obstetric history, unspecified trimester: Secondary | ICD-10-CM | POA: Diagnosis present

## 2018-03-29 DIAGNOSIS — B961 Klebsiella pneumoniae [K. pneumoniae] as the cause of diseases classified elsewhere: Secondary | ICD-10-CM

## 2018-03-29 DIAGNOSIS — O09293 Supervision of pregnancy with other poor reproductive or obstetric history, third trimester: Secondary | ICD-10-CM | POA: Insufficient documentation

## 2018-03-29 DIAGNOSIS — N390 Urinary tract infection, site not specified: Secondary | ICD-10-CM

## 2018-03-29 DIAGNOSIS — O0990 Supervision of high risk pregnancy, unspecified, unspecified trimester: Secondary | ICD-10-CM

## 2018-03-29 DIAGNOSIS — N883 Incompetence of cervix uteri: Secondary | ICD-10-CM

## 2018-03-29 DIAGNOSIS — O365990 Maternal care for other known or suspected poor fetal growth, unspecified trimester, not applicable or unspecified: Secondary | ICD-10-CM

## 2020-11-12 ENCOUNTER — Encounter: Payer: Medicare Other | Admitting: Obstetrics and Gynecology

## 2020-11-19 ENCOUNTER — Encounter: Payer: Medicare Other | Admitting: Obstetrics and Gynecology
